# Patient Record
Sex: Female | Born: 1965 | Race: White | Hispanic: No | Marital: Single | Smoking: Current some day smoker
Health system: Southern US, Community
[De-identification: ages and names within clinical notes are randomized; demographics above are authoritative.]

## PROBLEM LIST (undated history)

## (undated) DIAGNOSIS — S069XAA Unspecified intracranial injury with loss of consciousness status unknown, initial encounter: Secondary | ICD-10-CM

## (undated) DIAGNOSIS — S42209A Unspecified fracture of upper end of unspecified humerus, initial encounter for closed fracture: Secondary | ICD-10-CM

## (undated) DIAGNOSIS — R569 Unspecified convulsions: Secondary | ICD-10-CM

## (undated) DIAGNOSIS — S069X9A Unspecified intracranial injury with loss of consciousness of unspecified duration, initial encounter: Secondary | ICD-10-CM

## (undated) DIAGNOSIS — T07XXXA Unspecified multiple injuries, initial encounter: Secondary | ICD-10-CM

## (undated) DIAGNOSIS — Z5189 Encounter for other specified aftercare: Secondary | ICD-10-CM

## (undated) DIAGNOSIS — F419 Anxiety disorder, unspecified: Secondary | ICD-10-CM

## (undated) DIAGNOSIS — F3181 Bipolar II disorder: Secondary | ICD-10-CM

## (undated) DIAGNOSIS — C801 Malignant (primary) neoplasm, unspecified: Secondary | ICD-10-CM

## (undated) DIAGNOSIS — T7500XA Unspecified effects of lightning, initial encounter: Secondary | ICD-10-CM

## (undated) HISTORY — DX: Encounter for other specified aftercare: Z51.89

## (undated) HISTORY — DX: Unspecified convulsions: R56.9

## (undated) HISTORY — DX: Malignant (primary) neoplasm, unspecified: C80.1

## (undated) HISTORY — PX: COLON SURGERY: SHX602

## (undated) HISTORY — DX: Unspecified effects of lightning, initial encounter: T75.00XA

## (undated) HISTORY — DX: Unspecified intracranial injury with loss of consciousness of unspecified duration, initial encounter: S06.9X9A

## (undated) HISTORY — PX: ABDOMINAL SURGERY: SHX537

## (undated) HISTORY — PX: LIVER REPAIR: SHX6734

## (undated) HISTORY — DX: Bipolar II disorder: F31.81

## (undated) HISTORY — DX: Unspecified intracranial injury with loss of consciousness status unknown, initial encounter: S06.9XAA

## (undated) HISTORY — DX: Anxiety disorder, unspecified: F41.9

## (undated) HISTORY — DX: Unspecified multiple injuries, initial encounter: T07.XXXA

---
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
## 2017-12-11 DIAGNOSIS — S42209A Unspecified fracture of upper end of unspecified humerus, initial encounter for closed fracture: Secondary | ICD-10-CM

## 2017-12-11 HISTORY — DX: Unspecified fracture of upper end of unspecified humerus, initial encounter for closed fracture: S42.209A

## 2019-03-29 ENCOUNTER — Ambulatory Visit: Payer: Self-pay

## 2019-03-29 NOTE — Telephone Encounter (Signed)
Pt. Reports she had a nephrostomy tube removed "about 3 weeks ago at [facility]." "I don't want to deal with them anymore." Reports over the weekend she grey drainage from the area. No fever. Has 1/4 inch opening to left back. Reports she is cleaning the area and keeping it covered. Concerned she may need an antibiotic. Has an appointment to establish care with Dr. Wynetta Emery next week. No appointments available sooner. Pt. Will go to UC for treatment. Reason for Disposition . [1] Skin around the wound has become red AND [2] larger than 2 inches (5 cm)  Answer Assessment - Initial Assessment Questions 1. LOCATION: "Where is the wound located?"      Left lower back 2. WOUND APPEARANCE: "What does the wound look like?"      Has an opening - 1/4 inch 3. SIZE: If redness is present, ask: "What is the size of the red area?" (Inches, centimeters, or compare to size of a coin)      No 4. SPREAD: "What's changed in the last day?"  "Do you see any red streaks coming from the wound?"     No 5. ONSET: "When did it start to look infected?"      Had drainage Saturday 6. MECHANISM: "How did the wound start, what was the cause?"     Nephrostomy tube 7. PAIN: "Is there any pain?" If so, ask: "How bad is the pain?"   (Scale 1-10; or mild, moderate, severe)     5 8. FEVER: "Do you have a fever?" If so, ask: "What is your temperature, how was it measured, and when did it start?"     No 9. OTHER SYMPTOMS: "Do you have any other symptoms?" (e.g., shaking chills, weakness, rash elsewhere on body)     No 10. PREGNANCY: "Is there any chance you are pregnant?" "When was your last menstrual period?"       No  Protocols used: WOUND INFECTION-A-AH

## 2019-04-04 ENCOUNTER — Ambulatory Visit (INDEPENDENT_AMBULATORY_CARE_PROVIDER_SITE_OTHER): Payer: Medicare Other | Admitting: Family Medicine

## 2019-04-04 ENCOUNTER — Other Ambulatory Visit: Payer: Self-pay

## 2019-04-04 ENCOUNTER — Encounter: Payer: Self-pay | Admitting: Family Medicine

## 2019-04-04 VITALS — Ht 68.0 in | Wt 160.0 lb

## 2019-04-04 DIAGNOSIS — R1900 Intra-abdominal and pelvic swelling, mass and lump, unspecified site: Secondary | ICD-10-CM

## 2019-04-04 DIAGNOSIS — Z9119 Patient's noncompliance with other medical treatment and regimen: Secondary | ICD-10-CM

## 2019-04-04 DIAGNOSIS — F319 Bipolar disorder, unspecified: Secondary | ICD-10-CM

## 2019-04-04 DIAGNOSIS — N1330 Unspecified hydronephrosis: Secondary | ICD-10-CM | POA: Diagnosis not present

## 2019-04-04 DIAGNOSIS — C20 Malignant neoplasm of rectum: Secondary | ICD-10-CM

## 2019-04-04 DIAGNOSIS — N135 Crossing vessel and stricture of ureter without hydronephrosis: Secondary | ICD-10-CM

## 2019-04-04 DIAGNOSIS — Z91199 Patient's noncompliance with other medical treatment and regimen due to unspecified reason: Secondary | ICD-10-CM

## 2019-04-04 DIAGNOSIS — Z936 Other artificial openings of urinary tract status: Secondary | ICD-10-CM

## 2019-04-04 DIAGNOSIS — R569 Unspecified convulsions: Secondary | ICD-10-CM

## 2019-04-04 DIAGNOSIS — Z87820 Personal history of traumatic brain injury: Secondary | ICD-10-CM

## 2019-04-04 MED ORDER — SULFAMETHOXAZOLE-TRIMETHOPRIM 800-160 MG PO TABS: 1.0000 | ORAL_TABLET | Freq: Two times a day (BID) | ORAL | 0 refills | Status: DC

## 2019-04-04 NOTE — Assessment & Plan Note (Signed)
Has stopped all her medicine. States that she only has seizures "when I take the medicine." Refuses follow up with neurology. Hospitalized in June when she stopped her vipmat for 3 days. Will refer to new neurology when patient is willing to go.

## 2019-04-04 NOTE — Assessment & Plan Note (Addendum)
Of unclear etiology- possibly fibrotic from radiation or neoplastic. Refuses any CTs. Refuses referral to oncology.

## 2019-04-04 NOTE — Assessment & Plan Note (Signed)
Has an aunt who helps out a bit. Unclear how much this is effecting her cognition or her medical literacy.

## 2019-04-04 NOTE — Assessment & Plan Note (Signed)
Patient states that she does not have cancer. She states that "[facility] lied to her to get her money" and that she does not want to see an oncologist. Discussed today that there are certain things that I cannot care for- including her rectal cancer, and advised her that I could get her a 2nd opinion with another oncologist. She refused this and became upset at the discussion of seeing other providers. Will continue to monitor. May require palliative care- as she does not think she is sick, it is unclear if she would accept this either. Continue to monitor.

## 2019-04-04 NOTE — Assessment & Plan Note (Signed)
Has not been on any medicine because she refuses it. Agitated today- but generally euthymic and able to self-correct. States that she is taking supplements. Refuses referral to psychiatry.

## 2019-04-04 NOTE — Assessment & Plan Note (Signed)
Has had her PCN tube removed about 2-3 weeks ago. She states that it wasn't draining for 2 weeks and started draining about a week ago with a "boil." Refuses CT. Refuses to get another PCN. Refuses referral for 2nd opinion to urology. Continue home health. Informed her that if she gets fevers, chills or significantly worsening flank pain, she should go to the ER.

## 2019-04-04 NOTE — Assessment & Plan Note (Signed)
Discussed today that there are certain things that I cannot care for- including her rectal cancer and her hydronephrosis, and advised her that I could get her a 2nd opinion with another oncologist or urologist not at [facility]. She refused this and became upset at the discussion of seeing other providers stating "I have the right to refuse medical care." She declines any form of screening. She may be a candidate for palliative care, however, given that she does not think she has anything wrong with her, I'm not sure how this will go over.

## 2019-04-04 NOTE — Assessment & Plan Note (Signed)
Tube has been removed, continues with open wound on her back. Has been changing dressings with home health- small amount of gray discharge on the bandage, unclear if it was pus, will give 1 week of bactrim and get her into the office for evaluation in person. Warning signs to go to the ER discussed.
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
## 2019-04-13 ENCOUNTER — Encounter: Payer: Self-pay | Admitting: Family Medicine

## 2019-04-13 ENCOUNTER — Ambulatory Visit (INDEPENDENT_AMBULATORY_CARE_PROVIDER_SITE_OTHER): Payer: Medicare Other | Admitting: Family Medicine

## 2019-04-13 ENCOUNTER — Other Ambulatory Visit: Payer: Self-pay

## 2019-04-13 VITALS — BP 147/89 | HR 109 | Temp 98.4°F

## 2019-04-13 DIAGNOSIS — I700 Atherosclerosis of aorta: Secondary | ICD-10-CM

## 2019-04-13 DIAGNOSIS — C20 Malignant neoplasm of rectum: Secondary | ICD-10-CM

## 2019-04-13 DIAGNOSIS — G8929 Other chronic pain: Secondary | ICD-10-CM

## 2019-04-13 DIAGNOSIS — R5383 Other fatigue: Secondary | ICD-10-CM

## 2019-04-13 DIAGNOSIS — Z936 Other artificial openings of urinary tract status: Secondary | ICD-10-CM | POA: Diagnosis not present

## 2019-04-13 DIAGNOSIS — Z114 Encounter for screening for human immunodeficiency virus [HIV]: Secondary | ICD-10-CM

## 2019-04-13 DIAGNOSIS — N1330 Unspecified hydronephrosis: Secondary | ICD-10-CM

## 2019-04-13 DIAGNOSIS — Z9119 Patient's noncompliance with other medical treatment and regimen: Secondary | ICD-10-CM

## 2019-04-13 DIAGNOSIS — N135 Crossing vessel and stricture of ureter without hydronephrosis: Secondary | ICD-10-CM | POA: Diagnosis not present

## 2019-04-13 DIAGNOSIS — Z91199 Patient's noncompliance with other medical treatment and regimen due to unspecified reason: Secondary | ICD-10-CM

## 2019-04-13 DIAGNOSIS — M545 Low back pain: Secondary | ICD-10-CM

## 2019-04-13 DIAGNOSIS — Z13220 Encounter for screening for lipoid disorders: Secondary | ICD-10-CM

## 2019-04-13 LAB — MICROSCOPIC EXAMINATION
Bacteria, UA: NONE SEEN
RBC, Urine: NONE SEEN /hpf (ref 0–2)

## 2019-04-13 LAB — UA/M W/RFLX CULTURE, ROUTINE
Bilirubin, UA: NEGATIVE
Glucose, UA: NEGATIVE
Leukocytes,UA: NEGATIVE
Nitrite, UA: NEGATIVE
RBC, UA: NEGATIVE
Specific Gravity, UA: >1.03 — ABNORMAL HIGH (ref 1.005–1.030)
Urobilinogen, Ur: 0.2 mg/dL (ref 0.2–1.0)
pH, UA: 5 (ref 5.0–7.5)

## 2019-04-13 NOTE — Assessment & Plan Note (Signed)
Had PCN tube removed about 4-5 weeks ago. Appears to be draining small amount of yellow liquid. Appears to be closing. Does not want to go back to urology. Would consider renal ultrasound. Will think about it and call if she decides to. Call with any concerns. Checking labs today.

## 2019-04-13 NOTE — Assessment & Plan Note (Signed)
No sign of infection. Appears to be draining small amount of yellow liquid. Appears to be closing. Does not want to go back to urology. Would consider renal ultrasound. Will think about it and call if she decides to. Call with any concerns.

## 2019-04-13 NOTE — Progress Notes (Signed)
BP (!) 147/89   Pulse (!) 109   Temp 98.4 F (36.9 C)   SpO2 100%    Subjective:    Patient ID: Heather Turner, female    DOB: 1965/12/29, 53 y.o.   MRN: [id number]  HPI: Heather Turner is a 53 y.o. female  Chief Complaint  Patient presents with  . Wound Check   Felt well on the antibiotics. She has a nurse who comes by and she notes that she was feeling well. She notes that last night she was in a lot of pain. She was draining and having pus coming back.   Relevant past medical, surgical, family and social history reviewed and updated as indicated. Interim medical history since our last visit reviewed. Allergies and medications reviewed and updated.  Review of Systems  Constitutional: Negative.   Respiratory: Negative.   Cardiovascular: Negative.   Gastrointestinal: Positive for abdominal pain. Negative for abdominal distention, anal bleeding, blood in stool, constipation, diarrhea, nausea, rectal pain and vomiting.       Lower belly cramping  Musculoskeletal: Positive for back pain and myalgias. Negative for arthralgias, gait problem, joint swelling, neck pain and neck stiffness.  Skin: Positive for wound. Negative for color change, pallor and rash.  Psychiatric/Behavioral: Negative.     Per HPI unless specifically indicated above     Objective:    BP (!) 147/89   Pulse (!) 109   Temp 98.4 F (36.9 C)   SpO2 100%   Wt Readings from Last 3 Encounters:  04/04/19 160 lb (72.6 kg)    Physical Exam Vitals signs and nursing note reviewed.  Constitutional:      General: She is not in acute distress.    Appearance: Normal appearance. She is not ill-appearing, toxic-appearing or diaphoretic.  HENT:     Head: Normocephalic and atraumatic.     Right Ear: External ear normal.     Left Ear: External ear normal.     Nose: Nose normal.     Mouth/Throat:     Mouth: Mucous membranes are moist.     Pharynx: Oropharynx is clear.  Eyes:     General: No scleral icterus.        Right eye: No discharge.        Left eye: No discharge.     Extraocular Movements: Extraocular movements intact.     Conjunctiva/sclera: Conjunctivae normal.     Pupils: Pupils are equal, round, and reactive to light.  Neck:     Musculoskeletal: Normal range of motion and neck supple.  Cardiovascular:     Rate and Rhythm: Normal rate and regular rhythm.     Pulses: Normal pulses.     Heart sounds: Normal heart sounds. No murmur. No friction rub. No gallop.   Pulmonary:     Effort: Pulmonary effort is normal. No respiratory distress.     Breath sounds: Normal breath sounds. No stridor. No wheezing, rhonchi or rales.  Chest:     Chest wall: No tenderness.  Musculoskeletal: Normal range of motion.  Skin:    General: Skin is warm and dry.     Capillary Refill: Capillary refill takes less than 2 seconds.     Coloration: Skin is not jaundiced or pale.     Findings: No bruising, erythema, lesion or rash.     Comments: Open wound about 3 mm in size on L lower back draining clear yellow fluid. No redness, no swelling  Neurological:     General: No focal  deficit present.     Mental Status: She is alert and oriented to person, place, and time. Mental status is at baseline.  Psychiatric:        Mood and Affect: Mood normal.        Behavior: Behavior normal.        Thought Content: Thought content normal.        Judgment: Judgment normal.     No results found for this or any previous visit.    Assessment & Plan:   Problem List Items Addressed This Visit      Digestive   Rectal cancer [facility])    Patient previously stated that she does not have cancer. She stated that "[facility] lied to her to get her money" and that she does not want to see an oncologist. Discussed today that there are certain things that I cannot care for- including her rectal cancer, and advised her that I could get her a 2nd opinion with another oncologist. She refused this. Will check labs today. Will continue to  monitor. May require palliative care- as she does not think she is sick, it is unclear if she would accept this either. Will continue to establish report and see how things go.       Relevant Orders   CBC with Differential/Platelet   Comprehensive metabolic panel     Genitourinary   Hydronephrosis of left kidney    Had PCN tube removed about 4-5 weeks ago. Appears to be draining small amount of yellow liquid. Appears to be closing. Does not want to go back to urology. Would consider renal ultrasound. Will think about it and call if she decides to. Call with any concerns. Checking labs today.      Relevant Orders   CBC with Differential/Platelet   Comprehensive metabolic panel   UA/M w/rflx Culture, Routine   Ureteral obstruction, right    Had PCN tube removed about 4-5 weeks ago. Appears to be draining small amount of yellow liquid. Appears to be closing. Does not want to go back to urology. Would consider renal ultrasound. Will think about it and call if she decides to. Call with any concerns. Checking labs today.      Relevant Orders   CBC with Differential/Platelet   Comprehensive metabolic panel   UA/M w/rflx Culture, Routine     Other   History of noncompliance with medical treatment    Newly establishing care with patient. Will attempt to build report. Have offered renal ultrasound to look at status of hydronephrosis/periaortic mass- she will consider. Does not want CT due to radiation. Call with any concerns.       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 Appears to be draining small amount of yellow liquid. Appears to be closing. Does not want to go back to urology. Would consider renal ultrasound. Will think about it and call if she decides to. Call with any concerns.       Relevant Orders   CBC with Differential/Platelet   Comprehensive metabolic panel   UA/M w/rflx Culture, Routine    Other Visit Diagnoses    Screening  for cholesterol level       Labs drawn today. Await results.    Relevant Orders   Lipid Panel w/o Chol/HDL Ratio   Screening for HIV without presence of risk factors       Labs drawn today. Await results.    Relevant Orders  HIV Antibody (routine testing w rflx)   Fatigue, unspecified type       Labs drawn today. Await results.    Relevant Orders   TSH   Chronic right-sided low back pain without sciatica       Would like to see chiropractor. Referral generated today.    Relevant Orders   Ambulatory referral to Chiropractic       Follow up plan: Return in about 4 weeks (around 05/11/2019) for follow up .

## 2019-04-13 NOTE — Patient Instructions (Signed)
Abdominal or Pelvic Ultrasound An ultrasound is a test that uses sound waves to take pictures of the inside of the body. This is a safe and painless test that does not expose you to any X-rays. It is done using a handheld device (transducer) that is placed on your abdomen or pelvis and moved around. The transducer sends out sound waves that reflect off your tissues and organs to create images on a computer screen. An abdominal ultrasound takes pictures of the inside of the abdomen. A pelvic ultrasound takes pictures of the inside of the pelvis. An abdominal or pelvic ultrasound may be done to:  Check the shape or size of an organ.  Check for problems such as: ? Cysts. ? Masses. ? Inflammation. ? Kidney stones. ? Gallstones. Tell a health care provider about:  Any allergies you have.  All medicines you are taking, including vitamins, herbs, eye drops, creams, and over-the-counter medicines.  Any surgeries you have had.  Any medical conditions you have.  Whether you are pregnant or may be pregnant. What are the risks? There are no known risks or complications from having this test. What happens before the procedure?  Follow instructions from your health care provider about eating or drinking before the test.  Wear clothing that is easily washable in case the gel used for the test gets on your clothes. What happens during the procedure?   You will lie on an exam table.  Your lower abdomen and pelvis will be exposed.  A gel will be applied to your skin. It may feel cool.  The transducer will be pressed on your abdomen or pelvis and moved back and forth, through the gel, over the area to be examined.  The transducer will take pictures. These will be displayed on one or more computer monitors that look like small TV screens.  You may be asked to change your position.  After the exam, the gel will be cleaned off. What happens after the procedure?  It is up to you to get your  test results. Ask your health care provider, or the department that is doing the test, when your results will be ready.  Keep all follow-up visits as told by your health care provider. This is important. Summary  An ultrasound is a test that uses sound waves to take pictures of the inside of the body.  An abdominal or pelvic ultrasound may be done to check for cysts, masses, inflammation, kidney stones, or gallstones.  During the procedure, a handheld device (transducer) will be placed on your abdomen or pelvis and moved around over the area to be examined.  Ask your health care provider, or the department that is doing the test, when your results will be ready. This information is not intended to replace advice given to you by your health care provider. Make sure you discuss any questions you have with your health care provider. Document Released: 07/16/2000 Document Revised: 02/13/2018 Document Reviewed: 02/13/2018 Elsevier Patient Education  [place].

## 2019-04-13 NOTE — Assessment & Plan Note (Signed)
Newly establishing care with patient. Will attempt to build report. Have offered renal ultrasound to look at status of hydronephrosis/periaortic mass- she will consider. Does not want CT due to radiation. Call with any concerns.

## 2019-04-13 NOTE — Assessment & Plan Note (Signed)
Patient previously stated that she does not have cancer. She stated that "[facility] lied to her to get her money" and that she does not want to see an oncologist. Discussed today that there are certain things that I cannot care for- including her rectal cancer, and advised her that I could get her a 2nd opinion with another oncologist. She refused this. Will check labs today. Will continue to monitor. May require palliative care- as she does not think she is sick, it is unclear if she would accept this either. Will continue to establish report and see how things go.

## 2019-04-14 LAB — COMPREHENSIVE METABOLIC PANEL
ALT: 5 IU/L (ref 0–32)
AST: 11 IU/L (ref 0–40)
Albumin/Globulin Ratio: 1.1 — ABNORMAL LOW (ref 1.2–2.2)
Albumin: 3.8 g/dL (ref 3.8–4.8)
Alkaline Phosphatase: 114 IU/L (ref 39–117)
BUN/Creatinine Ratio: 14 (ref 9–23)
BUN: 12 mg/dL (ref 6–24)
Bilirubin Total: <0.2 mg/dL (ref 0.0–1.2)
CO2: 22 mmol/L (ref 20–29)
Calcium: 9.8 mg/dL (ref 8.7–10.2)
Chloride: 104 mmol/L (ref 96–106)
Creatinine, Ser: 0.85 mg/dL (ref 0.57–1.00)
GFR calc Af Amer: 92 mL/min/{1.73_m2} (ref >59)
GFR calc non Af Amer: 80 mL/min/{1.73_m2} (ref >59)
Globulin, Total: 3.4 g/dL (ref 1.5–4.5)
Glucose: 84 mg/dL (ref 65–99)
Potassium: 4.1 mmol/L (ref 3.5–5.2)
Sodium: 144 mmol/L (ref 134–144)
Total Protein: 7.2 g/dL (ref 6.0–8.5)

## 2019-04-14 LAB — CBC WITH DIFFERENTIAL/PLATELET
Basophils Absolute: 0 10*3/uL (ref 0.0–0.2)
Basos: 1 %
EOS (ABSOLUTE): 0.1 10*3/uL (ref 0.0–0.4)
Eos: 1 %
Hematocrit: 33 % — ABNORMAL LOW (ref 34.0–46.6)
Hemoglobin: 11.1 g/dL (ref 11.1–15.9)
Immature Grans (Abs): 0 10*3/uL (ref 0.0–0.1)
Immature Granulocytes: 0 %
Lymphocytes Absolute: 1.5 10*3/uL (ref 0.7–3.1)
Lymphs: 19 %
MCH: 29.1 pg (ref 26.6–33.0)
MCHC: 33.6 g/dL (ref 31.5–35.7)
MCV: 87 fL (ref 79–97)
Monocytes Absolute: 0.6 10*3/uL (ref 0.1–0.9)
Monocytes: 8 %
Neutrophils Absolute: 5.6 10*3/uL (ref 1.4–7.0)
Neutrophils: 71 %
Platelets: 441 10*3/uL (ref 150–450)
RBC: 3.81 x10E6/uL (ref 3.77–5.28)
RDW: 15.5 % — ABNORMAL HIGH (ref 11.7–15.4)
WBC: 7.9 10*3/uL (ref 3.4–10.8)

## 2019-04-14 LAB — TSH: TSH: 1.26 u[IU]/mL (ref 0.450–4.500)

## 2019-04-14 LAB — LIPID PANEL W/O CHOL/HDL RATIO
Cholesterol, Total: 131 mg/dL (ref 100–199)
HDL: 32 mg/dL — ABNORMAL LOW (ref >39)
LDL Chol Calc (NIH): 69 mg/dL (ref 0–99)
Triglycerides: 173 mg/dL — ABNORMAL HIGH (ref 0–149)
VLDL Cholesterol Cal: 30 mg/dL (ref 5–40)

## 2019-04-14 LAB — HIV ANTIBODY (ROUTINE TESTING W REFLEX): HIV Screen 4th Generation wRfx: NONREACTIVE

## 2019-04-15 ENCOUNTER — Encounter: Payer: Self-pay | Admitting: Family Medicine

## 2019-04-16 ENCOUNTER — Telehealth: Payer: Self-pay | Admitting: Family Medicine

## 2019-04-16 NOTE — Telephone Encounter (Signed)
Patient notified

## 2019-04-16 NOTE — Telephone Encounter (Signed)
Copied from [place] [id number]. Topic: General - Call Back - No Documentation >> Apr 16, 2019  3:23 PM Erick Blinks wrote: Reason for CRM: Pt called requesting call back to discuss lab results please advise  Best contacT [phone]

## 2019-04-16 NOTE — Telephone Encounter (Signed)
Letter was generated. OK to give results in the letter.

## 2019-05-01 ENCOUNTER — Telehealth: Payer: Self-pay | Admitting: Family Medicine

## 2019-05-01 DIAGNOSIS — N135 Crossing vessel and stricture of ureter without hydronephrosis: Secondary | ICD-10-CM

## 2019-05-01 DIAGNOSIS — C20 Malignant neoplasm of rectum: Secondary | ICD-10-CM

## 2019-05-01 DIAGNOSIS — Z936 Other artificial openings of urinary tract status: Secondary | ICD-10-CM

## 2019-05-01 NOTE — Telephone Encounter (Signed)
Pt called and stated that she is having extreme back pain. She would like to have something called in to [facility]. Tried to schedule an appt but pt declined.

## 2019-05-01 NOTE — Addendum Note (Signed)
Addended by: Valerie Roys on: 05/01/2019 01:50 PM   Modules accepted: Orders

## 2019-05-01 NOTE — Telephone Encounter (Signed)
I cannot call her in anything without her being seen.

## 2019-05-01 NOTE — Addendum Note (Signed)
Addended by: De Hollingshead on: 05/01/2019 01:41 PM   Modules accepted: Orders

## 2019-05-01 NOTE — Telephone Encounter (Signed)
Spoke with pt and advised her that Dr Wynetta Emery would like for her to come in to be seen. While speaking the pt began to cry. She stated that she didn't have a way to get here and that she now has no money. She stated that she has spent all her money on [place] powder, aleve and tylenol. After speaking with Dr Wynetta Emery I advised the pt that she would need to either come in to the office to be seen or she can go to the UC/ER. Pt became upset and began to cry again. She stated that because Dr Wynetta Emery is her doctor she would like to have something sent in. She stated that she was not going to the ER because they will only tell her that there are a lot of things going on and she is totally fine. While trying to explain why pt needed to come in she hung up. Provider notified. Marland Kitchen CCM referral already entered.

## 2019-05-01 NOTE — Telephone Encounter (Signed)
I'm happy to see her if she can get here. Has a history of non-compliance, rectal cancer, periaortic mass, renal obstruction. Had her nephrostomy tube removed. Not interested in having it replaced. Denies that anything is wrong with her. I cannot call her in medication without seeing her.

## 2019-05-01 NOTE — Telephone Encounter (Signed)
Placed Care Guide referral for transportation and other financial needs.   We actually do not have a CCM referral - Dr. Wynetta Emery, would you mind placing this for the CCM team?  Thanks!

## 2019-05-02 ENCOUNTER — Ambulatory Visit: Payer: Medicare Other | Admitting: *Deleted

## 2019-05-02 ENCOUNTER — Ambulatory Visit: Payer: Medicare Other | Admitting: Pharmacist

## 2019-05-02 DIAGNOSIS — G8929 Other chronic pain: Secondary | ICD-10-CM

## 2019-05-02 NOTE — Chronic Care Management (AMB) (Signed)
  Chronic Care Management   Note  05/02/2019 Name: Heather Turner MRN: [id number] DOB: 07-04-1966  Heather Turner is a 53 y.o. year old female who is a primary care patient of Valerie Roys, DO. The CCM team was consulted for assistance with chronic disease management and care coordination needs.    Contacted patient telephonically with Janci Minor, RN CM today, to discuss CCM team and to attempt to help with transportation concerns that are noted in the phone note from yesterday. Unable to review CCM program and consent information with patient. We asked about her pain, noted that she has been in pain since her nephrostomy tube was removed in August, but that the pain has been excruciating for the past 3 weeks. Notes she has tried Advil, Aleeve, and Tylenol, but any will only help for about 15 minutes. Offered to help with transportation to her PCP office; she declined, noting that she can't sit in a car, can't get groceries. Asked if she thought she needed to call EMS for transportation; she became irate, asking "is that the only reason you called today?" and wanting to stop "being bothered by y'all". Patient hung up on [place].   Alerting Dr. Wynetta Emery to the above.   Catie Darnelle Maffucci, PharmD Clinical Pharmacist [place] [phone]

## 2019-05-05 NOTE — Chronic Care Management (AMB) (Signed)
  Chronic Care Management   Outreach Note  05/05/2019 Name: Heather Turner MRN: [id number] DOB: [birth year]  Referred by: Valerie Roys, DO Reason for referral : Chronic Care Management (Initial Call for CCM )   An initial  telephone outreach was attempted today. The patient was referred to the case management team by for assistance with care management and care coordination.  Patient answered the phone and confirmed her identity. CCM Pharm D and I started the discussion around patient coming into the office related to patient complaining of severe back pain. Patient stated she could not manage to come in she was in too much pain. Suggested she may need to the ED if she was unable to come in to PCP office related to severe pain. Patient sounded as if she became very agitated and stated. "You just want me to come in there so you can take my money" Patient growled into the phone and hung up.  Made PCP aware of this interaction.   Follow Up Plan: The care management team will reach out to the patient again over the next 30 days.   Merlene Morse Lendora Keys RN, BSN Nurse Case Editor, [facility]  [phone]) [employer]

## 2019-05-15 ENCOUNTER — Telehealth: Payer: Self-pay

## 2019-05-15 NOTE — Telephone Encounter (Signed)
Called to provide transportation information. Pt states that they currently have transportation and they are in no need of additional assistance.

## 2019-05-16 ENCOUNTER — Telehealth: Payer: Self-pay

## 2019-05-17 ENCOUNTER — Other Ambulatory Visit: Payer: Self-pay

## 2019-05-17 ENCOUNTER — Ambulatory Visit (INDEPENDENT_AMBULATORY_CARE_PROVIDER_SITE_OTHER): Payer: Medicare Other | Admitting: Family Medicine

## 2019-05-17 ENCOUNTER — Telehealth: Payer: Self-pay | Admitting: Adult Health Nurse Practitioner

## 2019-05-17 ENCOUNTER — Encounter: Payer: Self-pay | Admitting: Family Medicine

## 2019-05-17 VITALS — BP 161/105 | HR 107 | Temp 98.6°F | Ht 68.0 in | Wt 157.0 lb

## 2019-05-17 DIAGNOSIS — R109 Unspecified abdominal pain: Secondary | ICD-10-CM

## 2019-05-17 DIAGNOSIS — Z91199 Patient's noncompliance with other medical treatment and regimen due to unspecified reason: Secondary | ICD-10-CM

## 2019-05-17 DIAGNOSIS — N135 Crossing vessel and stricture of ureter without hydronephrosis: Secondary | ICD-10-CM | POA: Diagnosis not present

## 2019-05-17 DIAGNOSIS — Z87820 Personal history of traumatic brain injury: Secondary | ICD-10-CM

## 2019-05-17 DIAGNOSIS — Z9119 Patient's noncompliance with other medical treatment and regimen: Secondary | ICD-10-CM

## 2019-05-17 DIAGNOSIS — C20 Malignant neoplasm of rectum: Secondary | ICD-10-CM

## 2019-05-17 DIAGNOSIS — N1330 Unspecified hydronephrosis: Secondary | ICD-10-CM

## 2019-05-17 MED ORDER — TRAZODONE HCL 50 MG PO TABS: 25.0000 mg | ORAL_TABLET | Freq: Every evening | ORAL | 3 refills | Status: DC | PRN

## 2019-05-17 MED ORDER — LIDOCAINE 5 % EX PTCH: 1.0000 | MEDICATED_PATCH | CUTANEOUS | 12 refills | Status: DC

## 2019-05-17 MED ORDER — DICLOFENAC SODIUM 1 % TD GEL: 4.0000 g | Freq: Four times a day (QID) | TRANSDERMAL | 3 refills | Status: DC

## 2019-05-17 NOTE — Assessment & Plan Note (Signed)
Getting progressively worse. Does not want to do [place] to look for hydronephrosis. Does not want to do CT or anything with radiation. She thinks that this is related her to her back and would like to do chiropractry- they were not able to move forward with that due to concern about the initial x-ray. We will arrange for her to have x-ray ASAP. Pain is making it hard for her to sleep. She is willing to have palliative come out- if they recommend pain medicine, we will consider- for now will treat with voltaren and lidocaine patches. Continue to monitor closely.

## 2019-05-17 NOTE — Assessment & Plan Note (Signed)
Does not want to do imaging right now. Will monitor. Will check BMP- will get palliative involved and she will see how chiropractor works. Continue to monitor.

## 2019-05-17 NOTE — Progress Notes (Signed)
BP (!) 161/105   Pulse (!) 107   Temp 98.6 F (37 C) (Oral)   Ht 5\' 8"  (1.727 m)   Wt 157 lb (71.2 kg)   SpO2 98%   BMI 23.87 kg/m    Subjective:    Patient ID: Heather Turner, female    DOB: 1966-06-03, 53 y.o.   MRN: [id number]  HPI: Heather Turner is a 53 y.o. female  Chief Complaint  Patient presents with  . Back Pain    4w f/u   Heather Turner presents today with her aunt for follow up. She has not been able to get into see the chiropractor due to medicare not covering the initial x-ray. She notes that her pain has been getting worse. She continues to be able to pee. She notes that she is waking up at night due to the pain. It's located in the middle of her low back. It also radiates into her L flank near her wound from her ostomy. That continues to drain, but not significantly. She is not having any fevers or chills. She is very uncomfortable and has quite a bit of pain. She does not want to do any imaging (she will do the x-ray for the chiropractor) as she doesn't want any radiation. She is still considering doing the [place]. She is otherwise feeling OK. No other concerns or complaints at this time.   Relevant past medical, surgical, family and social history reviewed and updated as indicated. Interim medical history since our last visit reviewed. Allergies and medications reviewed and updated.  Review of Systems  Constitutional: Negative.   Respiratory: Negative.   Cardiovascular: Negative.   Gastrointestinal: Negative.   Genitourinary: Positive for flank pain. Negative for decreased urine volume, difficulty urinating, dyspareunia, dysuria, enuresis, frequency, genital sores, hematuria, menstrual problem, pelvic pain, urgency, vaginal bleeding, vaginal discharge and vaginal pain.  Musculoskeletal: Positive for back pain and myalgias. Negative for arthralgias, gait problem, joint swelling, neck pain and neck stiffness.  Skin: Negative.   Neurological: Negative.    Psychiatric/Behavioral: Negative.     Per HPI unless specifically indicated above     Objective:    BP (!) 161/105   Pulse (!) 107   Temp 98.6 F (37 C) (Oral)   Ht 5\' 8"  (1.727 m)   Wt 157 lb (71.2 kg)   SpO2 98%   BMI 23.87 kg/m   Wt Readings from Last 3 Encounters:  05/17/19 157 lb (71.2 kg)  04/04/19 160 lb (72.6 kg)    Physical Exam Vitals signs and nursing note reviewed.  Constitutional:      General: She is not in acute distress.    Appearance: Normal appearance. She is not ill-appearing, toxic-appearing or diaphoretic.  HENT:     Head: Normocephalic and atraumatic.     Right Ear: External ear normal.     Left Ear: External ear normal.     Nose: Nose normal.     Mouth/Throat:     Mouth: Mucous membranes are moist.     Pharynx: Oropharynx is clear.  Eyes:     General: No scleral icterus.       Right eye: No discharge.        Left eye: No discharge.     Extraocular Movements: Extraocular movements intact.     Conjunctiva/sclera: Conjunctivae normal.     Pupils: Pupils are equal, round, and reactive to light.  Neck:     Musculoskeletal: Normal range of motion and neck supple.  Cardiovascular:  Rate and Rhythm: Normal rate and regular rhythm.     Pulses: Normal pulses.     Heart sounds: Normal heart sounds. No murmur. No friction rub. No gallop.   Pulmonary:     Effort: Pulmonary effort is normal. No respiratory distress.     Breath sounds: Normal breath sounds. No stridor. No wheezing, rhonchi or rales.  Chest:     Chest wall: No tenderness.  Abdominal:     General: Abdomen is flat. Bowel sounds are normal. There is no distension.     Palpations: Abdomen is soft. There is no mass.     Tenderness: There is no abdominal tenderness. There is left CVA tenderness. There is no guarding or rebound.     Hernia: No hernia is present.  Musculoskeletal: Normal range of motion.        General: Tenderness present.  Skin:    General: Skin is warm and dry.      Capillary Refill: Capillary refill takes less than 2 seconds.     Coloration: Skin is not jaundiced or pale.     Findings: No bruising, erythema, lesion or rash.  Neurological:     General: No focal deficit present.     Mental Status: She is alert and oriented to person, place, and time. Mental status is at baseline.  Psychiatric:        Mood and Affect: Mood normal.        Behavior: Behavior normal.        Thought Content: Thought content normal.        Judgment: Judgment normal.     Results for orders placed or performed in visit on [id number]  Basic metabolic panel  Result Value Ref Range   Glucose 77 65 - 99 mg/dL   BUN 13 6 - 24 mg/dL   Creatinine, Ser 0.88 0.57 - 1.00 mg/dL   GFR calc non Af Amer 77 >59 mL/min/1.73   GFR calc Af Amer 89 >59 mL/min/1.73   BUN/Creatinine Ratio 15 9 - 23   Sodium 142 134 - 144 mmol/L   Potassium 4.1 3.5 - 5.2 mmol/L   Chloride 101 96 - 106 mmol/L   CO2 25 20 - 29 mmol/L   Calcium 9.8 8.7 - 10.2 mg/dL      Assessment & Plan:   Problem List Items Addressed This Visit      Digestive   Rectal cancer ([place])   Relevant Medications   BAYER ASPIRIN PO   Other Relevant Orders   Amb Referral to Palliative Care     Nervous and Auditory   History of traumatic brain injury   Relevant Orders   Amb Referral to Palliative Care     Genitourinary   Hydronephrosis of left kidney    Does not want to do imaging right now. Will monitor. Will check BMP- will get palliative involved and she will see how chiropractor works. Continue to monitor. Start voltaren and lidoderm. Call if not getting better or getting worse.       Relevant Orders   Amb Referral to Palliative Care   Ureteral obstruction, right    Does not want to do imaging right now. Will monitor. Will check BMP- will get palliative involved and she will see how chiropractor works. Continue to monitor.       Relevant Orders   Amb Referral to Palliative Care     Other   History of  noncompliance with medical treatment    Now with increased pain  of unclear etiology, likely from hydronephrosis, however patient refuses imaging. Will get palliative involved. She thinks that her pain is due to muscular issues- will get her into chiropracty. Checking labs today. Await results.       Relevant Orders   Amb Referral to Palliative Care   Left flank pain - Primary    Getting progressively worse. Does not want to do [place] to look for hydronephrosis. Does not want to do CT or anything with radiation. She thinks that this is related her to her back and would like to do chiropractry- they were not able to move forward with that due to concern about the initial x-ray. We will arrange for her to have x-ray ASAP. Pain is making it hard for her to sleep. She is willing to have palliative come out- if they recommend pain medicine, we will consider- for now will treat with voltaren and lidocaine patches. Continue to monitor closely.      Relevant Orders   Basic metabolic panel (Completed)       Follow up plan: Return in about 2 weeks (around 05/31/2019).

## 2019-05-17 NOTE — Assessment & Plan Note (Signed)
Does not want to do imaging right now. Will monitor. Will check BMP- will get palliative involved and she will see how chiropractor works. Continue to monitor. Start voltaren and lidoderm. Call if not getting better or getting worse.

## 2019-05-17 NOTE — Assessment & Plan Note (Signed)
Now with increased pain of unclear etiology, likely from hydronephrosis, however patient refuses imaging. Will get palliative involved. She thinks that her pain is due to muscular issues- will get her into chiropracty. Checking labs today. Await results.

## 2019-05-17 NOTE — Telephone Encounter (Signed)
Spoke with patient regarding Palliative services and she did agree to this.  I have scheduled a Telephone Consult for 05/18/19 @ 12 Noon

## 2019-05-18 ENCOUNTER — Other Ambulatory Visit: Payer: Self-pay

## 2019-05-18 ENCOUNTER — Telehealth: Payer: Self-pay

## 2019-05-18 ENCOUNTER — Encounter: Payer: Self-pay | Admitting: Family Medicine

## 2019-05-18 ENCOUNTER — Ambulatory Visit: Payer: Medicare Other | Admitting: Family Medicine

## 2019-05-18 ENCOUNTER — Other Ambulatory Visit: Payer: Medicare Other | Admitting: Adult Health Nurse Practitioner

## 2019-05-18 DIAGNOSIS — Z515 Encounter for palliative care: Secondary | ICD-10-CM

## 2019-05-18 LAB — BASIC METABOLIC PANEL
BUN/Creatinine Ratio: 15 (ref 9–23)
BUN: 13 mg/dL (ref 6–24)
CO2: 25 mmol/L (ref 20–29)
Calcium: 9.8 mg/dL (ref 8.7–10.2)
Chloride: 101 mmol/L (ref 96–106)
Creatinine, Ser: 0.88 mg/dL (ref 0.57–1.00)
GFR calc Af Amer: 89 mL/min/{1.73_m2} (ref >59)
GFR calc non Af Amer: 77 mL/min/{1.73_m2} (ref >59)
Glucose: 77 mg/dL (ref 65–99)
Potassium: 4.1 mmol/L (ref 3.5–5.2)
Sodium: 142 mmol/L (ref 134–144)

## 2019-05-18 NOTE — Telephone Encounter (Signed)
At the direction of Amy NP, message via epic sent to PCP with an update on visit today including recommendations of beginning hydrocodone 5/325 mg QHS.
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
## 2019-05-21 ENCOUNTER — Other Ambulatory Visit: Payer: Self-pay | Admitting: Family Medicine

## 2019-05-21 MED ORDER — HYDROCODONE-ACETAMINOPHEN 5-325 MG PO TABS: 1.0000 | ORAL_TABLET | Freq: Every evening | ORAL | 0 refills | Status: DC | PRN

## 2019-05-25 ENCOUNTER — Other Ambulatory Visit: Payer: Self-pay | Admitting: Family Medicine

## 2019-05-25 MED ORDER — HYDROCODONE-ACETAMINOPHEN 5-325 MG PO TABS: 1.0000 | ORAL_TABLET | Freq: Every evening | ORAL | 0 refills | Status: DC | PRN

## 2019-05-25 NOTE — Telephone Encounter (Signed)
Requested medication (s) are due for refill today: yes  Requested medication (s) are on the active medication list: yes  Last refill:  05/21/2019  Future visit scheduled: yes  Notes to clinic:  Review for refill  Requested Prescriptions  Pending Prescriptions Disp Refills   HYDROcodone-acetaminophen (NORCO/VICODIN) 5-325 MG tablet 5 tablet 0    Sig: Take 1 tablet by mouth at bedtime as needed for moderate pain.     Not Delegated - Analgesics:  Opioid Agonist Combinations Failed - 05/25/2019 10:26 AM      Failed - This refill cannot be delegated      Failed - Urine Drug Screen completed in last 360 days.      Passed - Valid encounter within last 6 months    Recent Outpatient Visits          1 week ago Left flank pain   [place], Megan P, DO   1 month ago Status of artificial opening of urinary tract ([place])   [place], Megan P, DO   1 month ago History of traumatic brain injury   [place], DO      Future Appointments            In 1 week Wynetta Emery, Barb Merino, DO [employer]

## 2019-05-25 NOTE — Telephone Encounter (Signed)
Copied from [place] [id number]. Topic: Quick Communication - Rx Refill/Question >> May 25, 2019 10:21 AM Rainey Pines A wrote: Medication: HYDROcodone-acetaminophen (NORCO/VICODIN) 5-325 MG tablet  Has the patient contacted their pharmacy? {Yes (Agent: If no, request that the patient contact the pharmacy for the refill.) (Agent: If yes, when and what did the pharmacy advise?)Contact PCP  Preferred Pharmacy (with phone number or street name): [place], [place] - [address] [phone] (Phone) [phone] (Fax)    Agent: Please be advised that RX refills may take up to 3 business days. We ask that you follow-up with your pharmacy.

## 2019-05-25 NOTE — Telephone Encounter (Signed)
Patient is calling to check on the request of this medication refill Please advise

## 2019-05-25 NOTE — Telephone Encounter (Signed)
Patient notified that RX was sent in.

## 2019-05-31 ENCOUNTER — Other Ambulatory Visit: Payer: Medicare Other | Admitting: Adult Health Nurse Practitioner

## 2019-05-31 ENCOUNTER — Other Ambulatory Visit: Payer: Self-pay

## 2019-05-31 DIAGNOSIS — Z515 Encounter for palliative care: Secondary | ICD-10-CM
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
## 2019-06-01 ENCOUNTER — Ambulatory Visit: Payer: Medicare Other | Admitting: Family Medicine

## 2019-07-11 ENCOUNTER — Encounter: Payer: Self-pay | Admitting: Emergency Medicine

## 2019-07-11 ENCOUNTER — Observation Stay: Payer: Medicare Other

## 2019-07-11 ENCOUNTER — Inpatient Hospital Stay
Admission: EM | Admit: 2019-07-11 | Discharge: 2019-07-15 | DRG: 389 | Disposition: A | Discharge status: Home / Self Care | Payer: Medicare Other | Attending: Internal Medicine | Admitting: Internal Medicine

## 2019-07-11 ENCOUNTER — Other Ambulatory Visit: Payer: Self-pay

## 2019-07-11 ENCOUNTER — Emergency Department: Payer: Medicare Other

## 2019-07-11 DIAGNOSIS — M545 Low back pain: Secondary | ICD-10-CM

## 2019-07-11 DIAGNOSIS — Z9221 Personal history of antineoplastic chemotherapy: Secondary | ICD-10-CM

## 2019-07-11 DIAGNOSIS — F319 Bipolar disorder, unspecified: Secondary | ICD-10-CM

## 2019-07-11 DIAGNOSIS — F17210 Nicotine dependence, cigarettes, uncomplicated: Secondary | ICD-10-CM | POA: Diagnosis present

## 2019-07-11 DIAGNOSIS — Z91040 Latex allergy status: Secondary | ICD-10-CM

## 2019-07-11 DIAGNOSIS — C20 Malignant neoplasm of rectum: Secondary | ICD-10-CM | POA: Diagnosis not present

## 2019-07-11 DIAGNOSIS — R9389 Abnormal findings on diagnostic imaging of other specified body structures: Secondary | ICD-10-CM

## 2019-07-11 DIAGNOSIS — Z85048 Personal history of other malignant neoplasm of rectum, rectosigmoid junction, and anus: Secondary | ICD-10-CM

## 2019-07-11 DIAGNOSIS — Z880 Allergy status to penicillin: Secondary | ICD-10-CM

## 2019-07-11 DIAGNOSIS — C7800 Secondary malignant neoplasm of unspecified lung: Secondary | ICD-10-CM | POA: Diagnosis present

## 2019-07-11 DIAGNOSIS — N1330 Unspecified hydronephrosis: Secondary | ICD-10-CM

## 2019-07-11 DIAGNOSIS — C787 Secondary malignant neoplasm of liver and intrahepatic bile duct: Secondary | ICD-10-CM | POA: Diagnosis present

## 2019-07-11 DIAGNOSIS — E876 Hypokalemia: Secondary | ICD-10-CM | POA: Diagnosis present

## 2019-07-11 DIAGNOSIS — Z7189 Other specified counseling: Secondary | ICD-10-CM

## 2019-07-11 DIAGNOSIS — G8929 Other chronic pain: Secondary | ICD-10-CM | POA: Diagnosis present

## 2019-07-11 DIAGNOSIS — K56609 Unspecified intestinal obstruction, unspecified as to partial versus complete obstruction: Principal | ICD-10-CM

## 2019-07-11 DIAGNOSIS — Z9049 Acquired absence of other specified parts of digestive tract: Secondary | ICD-10-CM

## 2019-07-11 DIAGNOSIS — Z87440 Personal history of urinary (tract) infections: Secondary | ICD-10-CM

## 2019-07-11 DIAGNOSIS — Z87820 Personal history of traumatic brain injury: Secondary | ICD-10-CM

## 2019-07-11 DIAGNOSIS — C7989 Secondary malignant neoplasm of other specified sites: Secondary | ICD-10-CM | POA: Diagnosis present

## 2019-07-11 DIAGNOSIS — F3181 Bipolar II disorder: Secondary | ICD-10-CM | POA: Diagnosis present

## 2019-07-11 DIAGNOSIS — R3129 Other microscopic hematuria: Secondary | ICD-10-CM | POA: Diagnosis present

## 2019-07-11 DIAGNOSIS — Z515 Encounter for palliative care: Secondary | ICD-10-CM

## 2019-07-11 DIAGNOSIS — Z9119 Patient's noncompliance with other medical treatment and regimen: Secondary | ICD-10-CM

## 2019-07-11 DIAGNOSIS — N131 Hydronephrosis with ureteral stricture, not elsewhere classified: Secondary | ICD-10-CM | POA: Diagnosis present

## 2019-07-11 DIAGNOSIS — Z885 Allergy status to narcotic agent status: Secondary | ICD-10-CM

## 2019-07-11 DIAGNOSIS — Z79891 Long term (current) use of opiate analgesic: Secondary | ICD-10-CM

## 2019-07-11 DIAGNOSIS — N132 Hydronephrosis with renal and ureteral calculous obstruction: Secondary | ICD-10-CM

## 2019-07-11 DIAGNOSIS — Z79899 Other long term (current) drug therapy: Secondary | ICD-10-CM

## 2019-07-11 DIAGNOSIS — F419 Anxiety disorder, unspecified: Secondary | ICD-10-CM | POA: Diagnosis present

## 2019-07-11 DIAGNOSIS — Z5320 Procedure and treatment not carried out because of patient's decision for unspecified reasons: Secondary | ICD-10-CM | POA: Diagnosis not present

## 2019-07-11 DIAGNOSIS — Z888 Allergy status to other drugs, medicaments and biological substances status: Secondary | ICD-10-CM

## 2019-07-11 DIAGNOSIS — Z8249 Family history of ischemic heart disease and other diseases of the circulatory system: Secondary | ICD-10-CM

## 2019-07-11 DIAGNOSIS — I700 Atherosclerosis of aorta: Secondary | ICD-10-CM | POA: Diagnosis present

## 2019-07-11 DIAGNOSIS — Z7982 Long term (current) use of aspirin: Secondary | ICD-10-CM

## 2019-07-11 DIAGNOSIS — C799 Secondary malignant neoplasm of unspecified site: Secondary | ICD-10-CM

## 2019-07-11 DIAGNOSIS — Z20828 Contact with and (suspected) exposure to other viral communicable diseases: Secondary | ICD-10-CM | POA: Diagnosis present

## 2019-07-11 DIAGNOSIS — C786 Secondary malignant neoplasm of retroperitoneum and peritoneum: Secondary | ICD-10-CM | POA: Diagnosis present

## 2019-07-11 DIAGNOSIS — I10 Essential (primary) hypertension: Secondary | ICD-10-CM | POA: Diagnosis present

## 2019-07-11 LAB — BASIC METABOLIC PANEL
Anion gap: 11 (ref 5–15)
BUN: 16 mg/dL (ref 6–20)
CO2: 29 mmol/L (ref 22–32)
Calcium: 10.2 mg/dL (ref 8.9–10.3)
Chloride: 98 mmol/L (ref 98–111)
Creatinine, Ser: 0.84 mg/dL (ref 0.44–1.00)
GFR calc Af Amer: >60 mL/min (ref >60)
GFR calc non Af Amer: >60 mL/min (ref >60)
Glucose, Bld: 129 mg/dL — ABNORMAL HIGH (ref 70–99)
Potassium: 3.5 mmol/L (ref 3.5–5.1)
Sodium: 138 mmol/L (ref 135–145)

## 2019-07-11 LAB — RESPIRATORY PANEL BY RT PCR (FLU A&B, COVID)
Influenza A by PCR: NEGATIVE
Influenza B by PCR: NEGATIVE
SARS Coronavirus 2 by RT PCR: NEGATIVE

## 2019-07-11 LAB — CBC
HCT: 40.4 % (ref 36.0–46.0)
Hemoglobin: 12.9 g/dL (ref 12.0–15.0)
MCH: 27.4 pg (ref 26.0–34.0)
MCHC: 31.9 g/dL (ref 30.0–36.0)
MCV: 85.8 fL (ref 80.0–100.0)
Platelets: 377 10*3/uL (ref 150–400)
RBC: 4.71 MIL/uL (ref 3.87–5.11)
RDW: 15.3 % (ref 11.5–15.5)
WBC: 12.7 10*3/uL — ABNORMAL HIGH (ref 4.0–10.5)
nRBC: 0 % (ref 0.0–0.2)

## 2019-07-11 LAB — URINALYSIS, COMPLETE (UACMP) WITH MICROSCOPIC
Bacteria, UA: NONE SEEN
Bilirubin Urine: NEGATIVE
Glucose, UA: NEGATIVE mg/dL
Hgb urine dipstick: NEGATIVE
Ketones, ur: NEGATIVE mg/dL
Leukocytes,Ua: NEGATIVE
Nitrite: NEGATIVE
Protein, ur: 100 mg/dL — AB
Specific Gravity, Urine: 1.019 (ref 1.005–1.030)
pH: 6 (ref 5.0–8.0)

## 2019-07-11 LAB — PREGNANCY, URINE: Preg Test, Ur: NEGATIVE

## 2019-07-11 MED ORDER — DEXTROSE-NACL 5-0.45 % IV SOLN
INTRAVENOUS | Status: DC
  Administered 2019-07-11 – 2019-07-12 (×2): via INTRAVENOUS

## 2019-07-11 MED ORDER — SODIUM CHLORIDE 0.9 % IV BOLUS
1000.0000 mL | Freq: Once | INTRAVENOUS | Status: AC
  Administered 2019-07-11: 1000 mL via INTRAVENOUS

## 2019-07-11 MED ORDER — ONDANSETRON HCL 4 MG/2ML IJ SOLN: 4.0000 mg | Freq: Four times a day (QID) | INTRAMUSCULAR | Status: DC | PRN

## 2019-07-11 MED ORDER — ACETAMINOPHEN 650 MG RE SUPP: 650.0000 mg | Freq: Four times a day (QID) | RECTAL | Status: DC | PRN

## 2019-07-11 MED ORDER — HYDROCODONE-ACETAMINOPHEN 5-325 MG PO TABS: 1.0000 | ORAL_TABLET | Freq: Every evening | ORAL | Status: DC | PRN

## 2019-07-11 MED ORDER — ACETAMINOPHEN 325 MG PO TABS
650.0000 mg | ORAL_TABLET | Freq: Four times a day (QID) | ORAL | Status: DC | PRN
  Administered 2019-07-11 – 2019-07-13 (×3): 650 mg via ORAL
  Filled 2019-07-11 (×3): qty 2

## 2019-07-11 MED ORDER — ENOXAPARIN SODIUM 40 MG/0.4ML ~~LOC~~ SOLN
40.0000 mg | SUBCUTANEOUS | Status: DC
  Administered 2019-07-11 – 2019-07-14 (×4): 40 mg via SUBCUTANEOUS
  Filled 2019-07-11 (×5): qty 0.4

## 2019-07-11 MED ORDER — KETOROLAC TROMETHAMINE 30 MG/ML IJ SOLN
30.0000 mg | Freq: Once | INTRAMUSCULAR | Status: AC
  Administered 2019-07-11: 12:00:00 30 mg via INTRAVENOUS

## 2019-07-11 MED ORDER — TRAZODONE HCL 50 MG PO TABS
25.0000 mg | ORAL_TABLET | Freq: Every evening | ORAL | Status: DC | PRN
  Administered 2019-07-13: 21:00:00 50 mg via ORAL
  Filled 2019-07-11 (×2): qty 1

## 2019-07-11 MED ORDER — ONDANSETRON HCL 4 MG PO TABS
4.0000 mg | ORAL_TABLET | Freq: Four times a day (QID) | ORAL | Status: DC | PRN
  Filled 2019-07-11: qty 1

## 2019-07-11 MED ORDER — KETOROLAC TROMETHAMINE 30 MG/ML IJ SOLN
30.0000 mg | Freq: Once | INTRAMUSCULAR | Status: DC
  Filled 2019-07-11: qty 1

## 2019-07-11 MED ORDER — KETOROLAC TROMETHAMINE 30 MG/ML IJ SOLN
30.0000 mg | Freq: Four times a day (QID) | INTRAMUSCULAR | Status: DC | PRN
  Administered 2019-07-11 – 2019-07-15 (×10): 30 mg via INTRAVENOUS
  Filled 2019-07-11 (×14): qty 1

## 2019-07-11 MED ORDER — HYDROCODONE-ACETAMINOPHEN 5-325 MG PO TABS
1.0000 | ORAL_TABLET | Freq: Four times a day (QID) | ORAL | Status: DC | PRN
  Administered 2019-07-11 – 2019-07-15 (×14): 1 via ORAL
  Filled 2019-07-11 (×14): qty 1

## 2019-07-11 NOTE — ED Notes (Signed)
Provider at bedside to update patient.  

## 2019-07-11 NOTE — ED Notes (Signed)
Hospitalist to bedside.

## 2019-07-11 NOTE — ED Notes (Signed)
Attempted to insert NG, pt began screaming, grabbing nurse's hand and refused another attempt to insert NG.

## 2019-07-11 NOTE — ED Notes (Signed)
Pt ambulatory to Cpod
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
## 2019-07-11 NOTE — ED Notes (Signed)
Lab notified to add on Urine Culture. 

## 2019-07-11 NOTE — ED Notes (Signed)
Hospitalist, [place] aware of patient refusal of NG tube; states its ok to hold NG tube as long as patient is not vomiting.
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
## 2019-07-11 NOTE — ED Notes (Signed)
Patient transported to CT 

## 2019-07-11 NOTE — ED Notes (Signed)
Pharmacy to bedside; med req updated.

## 2019-07-11 NOTE — ED Notes (Signed)
C/O lower back pain x 1 month.  States back pain has prevented her from sleeping well for one month.  Lower abdominal pain and dysuria x 2 days.  AAOx3.  Skin warm and dry. NAD

## 2019-07-11 NOTE — ED Triage Notes (Signed)
C/O foul smelling urine and pain with urination x 2 days. States symptoms worsened this morning at 0200.

## 2019-07-11 NOTE — ED Triage Notes (Signed)
In via EMS from home with c/o UTI. EMS reports pt with dysuria and a fouls smell in her urine BP 180/100, NO meds, no allergies. 98.1Temp
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
## 2019-07-12 ENCOUNTER — Observation Stay: Payer: Medicare Other

## 2019-07-12 ENCOUNTER — Encounter: Payer: Self-pay | Admitting: Internal Medicine

## 2019-07-12 ENCOUNTER — Inpatient Hospital Stay: Payer: Medicare Other

## 2019-07-12 DIAGNOSIS — E876 Hypokalemia: Secondary | ICD-10-CM | POA: Diagnosis present

## 2019-07-12 DIAGNOSIS — C7800 Secondary malignant neoplasm of unspecified lung: Secondary | ICD-10-CM | POA: Diagnosis present

## 2019-07-12 DIAGNOSIS — Z7982 Long term (current) use of aspirin: Secondary | ICD-10-CM | POA: Diagnosis not present

## 2019-07-12 DIAGNOSIS — Z79899 Other long term (current) drug therapy: Secondary | ICD-10-CM | POA: Diagnosis not present

## 2019-07-12 DIAGNOSIS — Z91040 Latex allergy status: Secondary | ICD-10-CM | POA: Diagnosis not present

## 2019-07-12 DIAGNOSIS — G8929 Other chronic pain: Secondary | ICD-10-CM | POA: Diagnosis present

## 2019-07-12 DIAGNOSIS — Z20828 Contact with and (suspected) exposure to other viral communicable diseases: Secondary | ICD-10-CM | POA: Diagnosis present

## 2019-07-12 DIAGNOSIS — Z515 Encounter for palliative care: Secondary | ICD-10-CM | POA: Diagnosis not present

## 2019-07-12 DIAGNOSIS — C7989 Secondary malignant neoplasm of other specified sites: Secondary | ICD-10-CM | POA: Diagnosis present

## 2019-07-12 DIAGNOSIS — Z885 Allergy status to narcotic agent status: Secondary | ICD-10-CM | POA: Diagnosis not present

## 2019-07-12 DIAGNOSIS — F17210 Nicotine dependence, cigarettes, uncomplicated: Secondary | ICD-10-CM | POA: Diagnosis present

## 2019-07-12 DIAGNOSIS — Z880 Allergy status to penicillin: Secondary | ICD-10-CM | POA: Diagnosis not present

## 2019-07-12 DIAGNOSIS — N131 Hydronephrosis with ureteral stricture, not elsewhere classified: Secondary | ICD-10-CM | POA: Diagnosis present

## 2019-07-12 DIAGNOSIS — K56609 Unspecified intestinal obstruction, unspecified as to partial versus complete obstruction: Secondary | ICD-10-CM | POA: Diagnosis present

## 2019-07-12 DIAGNOSIS — R3129 Other microscopic hematuria: Secondary | ICD-10-CM | POA: Diagnosis present

## 2019-07-12 DIAGNOSIS — I10 Essential (primary) hypertension: Secondary | ICD-10-CM | POA: Diagnosis present

## 2019-07-12 DIAGNOSIS — Z87440 Personal history of urinary (tract) infections: Secondary | ICD-10-CM | POA: Diagnosis not present

## 2019-07-12 DIAGNOSIS — F419 Anxiety disorder, unspecified: Secondary | ICD-10-CM | POA: Diagnosis present

## 2019-07-12 DIAGNOSIS — Z888 Allergy status to other drugs, medicaments and biological substances status: Secondary | ICD-10-CM | POA: Diagnosis not present

## 2019-07-12 DIAGNOSIS — C787 Secondary malignant neoplasm of liver and intrahepatic bile duct: Secondary | ICD-10-CM | POA: Diagnosis present

## 2019-07-12 DIAGNOSIS — Z87820 Personal history of traumatic brain injury: Secondary | ICD-10-CM | POA: Diagnosis not present

## 2019-07-12 DIAGNOSIS — Z79891 Long term (current) use of opiate analgesic: Secondary | ICD-10-CM | POA: Diagnosis not present

## 2019-07-12 DIAGNOSIS — F3181 Bipolar II disorder: Secondary | ICD-10-CM | POA: Diagnosis present

## 2019-07-12 DIAGNOSIS — C799 Secondary malignant neoplasm of unspecified site: Secondary | ICD-10-CM | POA: Diagnosis not present

## 2019-07-12 DIAGNOSIS — I700 Atherosclerosis of aorta: Secondary | ICD-10-CM | POA: Diagnosis present

## 2019-07-12 DIAGNOSIS — C20 Malignant neoplasm of rectum: Secondary | ICD-10-CM | POA: Diagnosis not present

## 2019-07-12 DIAGNOSIS — C786 Secondary malignant neoplasm of retroperitoneum and peritoneum: Secondary | ICD-10-CM | POA: Diagnosis present

## 2019-07-12 LAB — COMPREHENSIVE METABOLIC PANEL
ALT: 7 U/L (ref 0–44)
AST: 11 U/L — ABNORMAL LOW (ref 15–41)
Albumin: 3.2 g/dL — ABNORMAL LOW (ref 3.5–5.0)
Alkaline Phosphatase: 107 U/L (ref 38–126)
Anion gap: 8 (ref 5–15)
BUN: 12 mg/dL (ref 6–20)
CO2: 28 mmol/L (ref 22–32)
Calcium: 9 mg/dL (ref 8.9–10.3)
Chloride: 103 mmol/L (ref 98–111)
Creatinine, Ser: 0.7 mg/dL (ref 0.44–1.00)
GFR calc Af Amer: >60 mL/min (ref >60)
GFR calc non Af Amer: >60 mL/min (ref >60)
Glucose, Bld: 118 mg/dL — ABNORMAL HIGH (ref 70–99)
Potassium: 2.9 mmol/L — ABNORMAL LOW (ref 3.5–5.1)
Sodium: 139 mmol/L (ref 135–145)
Total Bilirubin: 0.7 mg/dL (ref 0.3–1.2)
Total Protein: 6.4 g/dL — ABNORMAL LOW (ref 6.5–8.1)

## 2019-07-12 LAB — URINE CULTURE

## 2019-07-12 LAB — MAGNESIUM: Magnesium: 2.2 mg/dL (ref 1.7–2.4)

## 2019-07-12 LAB — PHOSPHORUS: Phosphorus: 3.3 mg/dL (ref 2.5–4.6)

## 2019-07-12 MED ORDER — LORAZEPAM 2 MG/ML IJ SOLN
1.0000 mg | Freq: Once | INTRAMUSCULAR | Status: AC
  Administered 2019-07-12: 1 mg via INTRAVENOUS
  Filled 2019-07-12: qty 1

## 2019-07-12 MED ORDER — KCL IN DEXTROSE-NACL 40-5-0.9 MEQ/L-%-% IV SOLN
INTRAVENOUS | Status: DC
  Administered 2019-07-12 – 2019-07-14 (×6): via INTRAVENOUS
  Filled 2019-07-12 (×8): qty 1000

## 2019-07-12 MED ORDER — LABETALOL HCL 5 MG/ML IV SOLN
5.0000 mg | Freq: Four times a day (QID) | INTRAVENOUS | Status: DC | PRN
  Administered 2019-07-12 – 2019-07-15 (×4): 5 mg via INTRAVENOUS
  Filled 2019-07-12 (×4): qty 4

## 2019-07-12 MED ORDER — GADOBUTROL 1 MMOL/ML IV SOLN
7.0000 mL | Freq: Once | INTRAVENOUS | Status: AC | PRN
  Administered 2019-07-12: 7 mL via INTRAVENOUS

## 2019-07-12 NOTE — Progress Notes (Signed)
Patient refused nasogastric tube. Patient states "I'm not gonna do it. It hurts too much and I don't want it." Dr. Peyton Najjar notified.

## 2019-07-12 NOTE — Progress Notes (Signed)
Complained of chronic back pain, requested an ice back; small ice pack applied;Barbaraann Faster, RN 3:50 AM; 07/12/2019

## 2019-07-12 NOTE — Progress Notes (Signed)
MRI called to say that they would try to get patient down around 0700; acknowledged. Patient able to sleep some. No nausea/vomitting overnight. Barbaraann Faster, RN 6:02 AM 07/12/2019

## 2019-07-12 NOTE — Progress Notes (Addendum)
Progress Note    Heather Turner  [id number] DOB: April 19, 1966  DOA: 07/11/2019 PCP: Valerie Roys, DO        Assessment/Plan:   Active Problems:   SBO (small bowel obstruction) (HCC)   Body mass index is 23.77 kg/m.   Small bowel obstruction: Keep n.p.o. for now.  She refused NG tube placement.  She could not complete the MRI.  She attributed this to pain from laying on the MRI stretcher.  Even after giving her pain medicines prior to second attempt for MRI test, patient did not complete the test.  IV fluids for hydration.  Analgesics as needed for pain.  Follow-up with general surgeon for further recommendations.  Hypertension: IV labetalol as needed for severe hypertension.  Hypokalemia: Replete potassium  Severe left hydroureteronephrosis: Previously, she had a percutaneous nephrostomy tube for this and was removed in August 2020 per her request.  Patient was seen by the urologist and patient refused to have a nephrostomy tube.  Rectal cancer with nodules in the lung and liver: Patient did not have the MRI test.  She likely has advanced rectal cancer.  Patient elected not to see oncology again because she thinks she does not have cancer  History of bipolar disorder: No longer on medications.     Family Communication/Anticipated D/C date and plan/Code Status   DVT prophylaxis: Lovenox Code Status: Full code Family Communication: None Disposition Plan: To be determined      Subjective:   C/o abdominal pain and low back pain.  She said back pain is a chronic pain she has been dealing with.  She has had abdominal pain for the past few days.  No bowel movement for 1 week.  No vomiting.  Objective:    Vitals:   07/12/19 0009 07/12/19 0027 07/12/19 0418 07/12/19 0523  BP: (!) 159/90 (!) 169/98 (!) 167/96   Pulse:  96 94   Resp:  18 18   Temp:  98.1 F (36.7 C) 98.1 F (36.7 C)   TempSrc:  Oral Oral   SpO2:  100% 98%   Weight:    70.9 kg  Height:     5\' 8"  (1.727 m)    Intake/Output Summary (Last 24 hours) at 07/12/2019 1149 Last data filed at 07/12/2019 1029 Gross per 24 hour  Intake 1805.01 ml  Output 350 ml  Net 1455.01 ml   Filed Weights   07/12/19 0523  Weight: 70.9 kg    Exam:  GEN: NAD SKIN: No rash EYES: EOMI ENT: MMM CV: RRR PULM: CTA B ABD: soft, ND, right-sided tenderness without rebound tenderness or guarding, +BS CNS: AAO x 3, non focal EXT: No edema or tenderness MSK: Mild lumbar spinal tenderness  Data Reviewed:   I have personally reviewed following labs and imaging studies:  Labs: Labs show the following:   Basic Metabolic Panel: Recent Labs  Lab 07/11/19 0951 07/12/19 0442  NA 138 139  K 3.5 2.9*  CL 98 103  CO2 29 28  GLUCOSE 129* 118*  BUN 16 12  CREATININE 0.84 0.70  CALCIUM 10.2 9.0  MG  --  2.2  PHOS  --  3.3   GFR Estimated Creatinine Clearance: 84.9 mL/min (by C-G formula based on SCr of 0.7 mg/dL). Liver Function Tests: Recent Labs  Lab 07/12/19 0442  AST 11*  ALT 7  ALKPHOS 107  BILITOT 0.7  PROT 6.4*  ALBUMIN 3.2*   No results for input(s): LIPASE, AMYLASE in the last  168 hours. No results for input(s): AMMONIA in the last 168 hours. Coagulation profile No results for input(s): INR, PROTIME in the last 168 hours.  CBC: Recent Labs  Lab 07/11/19 0951  WBC 12.7*  HGB 12.9  HCT 40.4  MCV 85.8  PLT 377   Cardiac Enzymes: No results for input(s): CKTOTAL, CKMB, CKMBINDEX, TROPONINI in the last 168 hours. BNP (last 3 results) No results for input(s): PROBNP in the last 8760 hours. CBG: No results for input(s): GLUCAP in the last 168 hours. D-Dimer: No results for input(s): DDIMER in the last 72 hours. Hgb A1c: No results for input(s): HGBA1C in the last 72 hours. Lipid Profile: No results for input(s): CHOL, HDL, LDLCALC, TRIG, CHOLHDL, LDLDIRECT in the last 72 hours. Thyroid function studies: No results for input(s): TSH, T4TOTAL, T3FREE,  THYROIDAB in the last 72 hours.  Invalid input(s): FREET3 Anemia work up: No results for input(s): VITAMINB12, FOLATE, FERRITIN, TIBC, IRON, RETICCTPCT in the last 72 hours. Sepsis Labs: Recent Labs  Lab 07/11/19 0951  WBC 12.7*    Microbiology Recent Results (from the past 240 hour(s))  Respiratory Panel by RT PCR (Flu A&B, Covid) - Nasopharyngeal Swab     Status: None   Collection Time: 07/11/19  2:20 PM   Specimen: Nasopharyngeal Swab  Result Value Ref Range Status   SARS Coronavirus 2 by RT PCR NEGATIVE NEGATIVE Final    Comment: (NOTE) SARS-CoV-2 target nucleic acids are NOT DETECTED. The SARS-CoV-2 RNA is generally detectable in upper respiratoy specimens during the acute phase of infection. The lowest concentration of SARS-CoV-2 viral copies this assay can detect is 131 copies/mL. A negative result does not preclude SARS-Cov-2 infection and should not be used as the sole basis for treatment or other patient management decisions. A negative result may occur with  improper specimen collection/handling, submission of specimen other than nasopharyngeal swab, presence of viral mutation(s) within the areas targeted by this assay, and inadequate number of viral copies (<131 copies/mL). A negative result must be combined with clinical observations, patient history, and epidemiological information. The expected result is Negative. Fact Sheet for Patients:  PinkCheek.be Fact Sheet for Healthcare Providers:  GravelBags.it This test is not yet ap proved or cleared by the [place] FDA and  has been authorized for detection and/or diagnosis of SARS-CoV-2 by FDA under an Emergency Use Authorization (EUA). This EUA will remain  in effect (meaning this test can be used) for the duration of the COVID-19 declaration under Section 564(b)(1) of the Act, 21 U.S.C. section 360bbb-3(b)(1), unless the authorization is terminated or  revoked sooner.    Influenza A by PCR NEGATIVE NEGATIVE Final   Influenza B by PCR NEGATIVE NEGATIVE Final    Comment: (NOTE) The Xpert Xpress SARS-CoV-2/FLU/RSV assay is intended as an aid in  the diagnosis of influenza from Nasopharyngeal swab specimens and  should not be used as a sole basis for treatment. Nasal washings and  aspirates are unacceptable for Xpert Xpress SARS-CoV-2/FLU/RSV  testing. Fact Sheet for Patients: PinkCheek.be Fact Sheet for Healthcare Providers: GravelBags.it This test is not yet approved or cleared by the [place] FDA and  has been authorized for detection and/or diagnosis of SARS-CoV-2 by  FDA under an Emergency Use Authorization (EUA). This EUA will remain  in effect (meaning this test can be used) for the duration of the  Covid-19 declaration under Section 564(b)(1) of the Act, 21  U.S.C. section 360bbb-3(b)(1), unless the authorization is  terminated or revoked. Performed at  [facility], [address] [place], [place] [address]     Procedures and diagnostic studies:  DG Skull 1-3 Views  Result Date: 07/11/2019 CLINICAL DATA:  MRI clearance EXAM: SKULL - 1-3 VIEW COMPARISON:  None. FINDINGS: Changes consistent with dental fillings are noted. Prior fixation sideplate is noted in the anterior aspect of right mandible. No radiopaque foreign bodies are noted within the orbits. No other focal abnormality is seen. IMPRESSION: Surgical fixation sideplate in the right mandible. No metallic foreign bodies are noted. Electronically Signed   By: Inez Catalina M.D.   On: 07/11/2019 18:03   CT Renal Stone Study  Result Date: 07/11/2019 CLINICAL DATA:  Low back pain for 1 month. No known injury. History of rectal cancer and left hydronephrosis. EXAM: CT ABDOMEN AND PELVIS WITHOUT CONTRAST TECHNIQUE: Multidetector CT imaging of the abdomen and pelvis was performed following the standard  protocol without IV contrast. COMPARISON:  None. FINDINGS: Lower chest: There is a nodule in the left lower lobe measuring 1.5 x 1 cm on axial image 11 by 0.9 cm on sagittal image 132. The nodule appears part solid. A 0.4 cm right lower lobe nodule is identified on image 12 of series 4 and a 0.7 cm left lower lobe nodule seen on image 13. Punctate left lower lobe nodule on image 15 also noted. Mild linear atelectasis or scar in the right middle lobe is noted. Lung bases otherwise clear. No pleural or pericardial effusion. Hepatobiliary: A 1.1 cm in diameter hypoattenuating lesion in the posterior right hepatic lobe on is seen on image 24. There is a second hypoattenuating lesion in the lateral segment of the left hepatic lobe measuring 1 cm on image 23. The liver is otherwise unremarkable. Gallbladder and biliary tree appear normal. Pancreas: Unremarkable. No pancreatic ductal dilatation or surrounding inflammatory changes. Spleen: Normal in size without focal abnormality. Adrenals/Urinary Tract: The patient has severe left hydronephrosis. Intermediate Hyperattenuating material is seen in the left renal pelvis most worrisome for hemorrhage possibly debris. No obstructing stone is identified. Scarring in the left flank has an appearance most consistent with the presence of a nephrostomy tube in the past. The right kidney appears normal. The urinary bladder is almost completely decompressed. The adrenal glands are unremarkable. Stomach/Bowel: Small bowel loops are dilated up to approximately 3.5 cm with air-fluid levels present. Fecalized contents in small bowel are seen in the central pelvis and distal loops are completely decompressed consistent with obstruction. There is some gas and stool in the ascending colon. Suture material at the rectosigmoid colon noted. There is gas and stool in the ascending and transverse colon. The remainder of the colon is decompressed. The stomach and appendix appear normal.  Vascular/Lymphatic: Aortic atherosclerosis. No enlarged abdominal or pelvic lymph nodes. Reproductive: Uterus and bilateral adnexa are unremarkable. Other: There is a small volume of free fluid about the spleen. No free air. Musculoskeletal: No acute or focal abnormality. IMPRESSION: The examination is positive for small bowel obstruction. Transition point appears to be in the pelvis and the obstruction is likely due to adhesions. Two hypoattenuating lesions in the liver cannot be definitively characterized. Given the patient's history of cancer, metastatic disease is possible. MRI with and without contrast could be used for further evaluation. Severe left hydronephrosis. Cause for hydronephrosis is not identified. There is some high attenuation material within the renal pelvis which could be due to hemorrhage although the patient has no blood in her urine on urinalysis today. Mass lesion or proteinaceous debris  are also possible. There is a tract in the left flank from a prior nephrostomy tube. Small volume of free fluid in the left upper quadrant scratch the small volume of perisplenic fluid of unclear etiology. 3 pulmonary nodules are identified. The largest is in the posterior left lower lobe measuring 1.5 cm. The nodule appears part solid. Follow-up non-contrast CT recommended at 3-6 months to confirm persistence. If unchanged, and solid component remains <6 mm, annual CT is recommended until 5 years of stability has been established. If persistent these nodules should be considered highly suspicious if the solid component of the nodule is 6 mm or greater in size and enlarging. This recommendation follows the consensus statement: Guidelines for Management of Incidental Pulmonary Nodules Detected on CT Images: From the Fleischner Society 2017; Radiology 2017; 284:228-243. Atherosclerosis. Electronically Signed   By: Inge Rise M.D.   On: 07/11/2019 12:11    Medications:   . enoxaparin (LOVENOX)  injection  40 mg Subcutaneous Q24H   Continuous Infusions: . dextrose 5 % and 0.9 % NaCl with KCl 40 mEq/L 100 mL/hr at 07/12/19 0856     LOS: 0 days   Nilesh Stegall  [facility]   *Please refer to [email or web address], password [email or web address] to get updated schedule on who will round on this patient, as hospitalists switch teams weekly. If 7PM-7AM, please contact night-coverage at [email or web address], password [email or web address] for any overnight needs.  07/12/2019, 11:49 AM

## 2019-07-12 NOTE — Care Management Obs Status (Signed)
[place] [facility] NOTIFICATION   Patient Details  Name: Inice Martinovic MRN: [id number] Date of Birth: 08/08/1965   Medicare Observation Status Notification Given:  Yes    Trecia Rogers, LCSW 07/12/2019, 2:17 PM

## 2019-07-12 NOTE — Progress Notes (Signed)
[facility] Hospital Day(s): 0.   Post op day(s):  Marland Kitchen   Interval History: Patient seen and examined, no acute events or new complaints overnight. Patient reports tenderness in her abdomen.  Cannot specify the area of the pain.  Pain does not radiate to the part of the body.  No alleviating or aggravating factor.  Denies nausea or vomiting.  Denies passing gas.  Vital signs in last 24 hours: [min-max] current  Temp:  [98.1 F (36.7 C)-98.3 F (36.8 C)] 98.1 F (36.7 C) (12/10 0418) Pulse Rate:  [94-103] 94 (12/10 0418) Resp:  [18] 18 (12/10 0418) BP: (159-169)/(90-100) 167/96 (12/10 0418) SpO2:  [96 %-100 %] 98 % (12/10 0418) Weight:  [70.9 kg] 70.9 kg (12/10 0523)     Height: 5\' 8"  (172.7 cm) Weight: 70.9 kg BMI (Calculated): 23.77   Physical Exam:  Constitutional: alert, cooperative and no distress  Respiratory: breathing non-labored at rest  Cardiovascular: regular rate and sinus rhythm  Gastrointestinal: soft, non-tender, and distended and tympanic  Labs:  CBC Latest Ref Rng & Units 07/11/2019 04/13/2019  WBC 4.0 - 10.5 K/uL 12.7(H) 7.9  Hemoglobin 12.0 - 15.0 g/dL 12.9 11.1  Hematocrit 36.0 - 46.0 % 40.4 33.0(L)  Platelets 150 - 400 K/uL 377 441   CMP Latest Ref Rng & Units 07/12/2019 07/11/2019 05/17/2019  Glucose 70 - 99 mg/dL 118(H) 129(H) 77  BUN 6 - 20 mg/dL 12 16 13   Creatinine 0.44 - 1.00 mg/dL 0.70 0.84 0.88  Sodium 135 - 145 mmol/L 139 138 142  Potassium 3.5 - 5.1 mmol/L 2.9(L) 3.5 4.1  Chloride 98 - 111 mmol/L 103 98 101  CO2 22 - 32 mmol/L 28 29 25   Calcium 8.9 - 10.3 mg/dL 9.0 10.2 9.8  Total Protein 6.5 - 8.1 g/dL 6.4(L) - -  Total Bilirubin 0.3 - 1.2 mg/dL 0.7 - -  Alkaline Phos 38 - 126 U/L 107 - -  AST 15 - 41 U/L 11(L) - -  ALT 0 - 44 U/L 7 - -    Imaging studies: I personally evaluated the abdominal x-ray.  There is small bowel dilation with a lot of stool in the right colon.  Descending colon and rectum not appreciated.  No free  air.   Assessment/Plan:  52 y.o. female with small bowel obstruction, complicated by pertinent comorbidities including anxiety, history of rectal cancer without completion of adjuvant therapy, anxiety, bipolar 2 disorder, seizures. Patient with persistent small bowel obstruction.  Trial was to avoid NGT due to patient anxiety but with the persistent small bowel obstruction the best treatment for her will be to put NG tube for small bowel decompression.  With NGT in place may proceed with Gastrografin challenge.  I agree with MRI order.  I will follow her with physical exam.  No worsening abdominal pain, no clinical deterioration.  We will try to continue conservative management.   Arnold Long, MD

## 2019-07-12 NOTE — Progress Notes (Signed)
Secure chatted admitting MD prior to computer downtime; without response until this am (was off); secure chatted covering NP X3 without response to have admitting orders that are "signed and held" to be reviewed so that they can be released; [facility] [phone] and asked for covering NP to review admitting orders so that they can be released; [facility], Domingo Pulse also notified. Barbaraann Faster, RN; 4:52 AM; 07/12/2019

## 2019-07-13 ENCOUNTER — Inpatient Hospital Stay: Payer: Medicare Other

## 2019-07-13 DIAGNOSIS — C20 Malignant neoplasm of rectum: Secondary | ICD-10-CM

## 2019-07-13 DIAGNOSIS — Z7189 Other specified counseling: Secondary | ICD-10-CM

## 2019-07-13 DIAGNOSIS — C799 Secondary malignant neoplasm of unspecified site: Secondary | ICD-10-CM

## 2019-07-13 DIAGNOSIS — Z515 Encounter for palliative care: Secondary | ICD-10-CM

## 2019-07-13 LAB — BASIC METABOLIC PANEL
Anion gap: 11 (ref 5–15)
BUN: 10 mg/dL (ref 6–20)
CO2: 25 mmol/L (ref 22–32)
Calcium: 9 mg/dL (ref 8.9–10.3)
Chloride: 104 mmol/L (ref 98–111)
Creatinine, Ser: 0.77 mg/dL (ref 0.44–1.00)
GFR calc Af Amer: >60 mL/min (ref >60)
GFR calc non Af Amer: >60 mL/min (ref >60)
Glucose, Bld: 90 mg/dL (ref 70–99)
Potassium: 3 mmol/L — ABNORMAL LOW (ref 3.5–5.1)
Sodium: 140 mmol/L (ref 135–145)

## 2019-07-13 MED ORDER — AMLODIPINE BESYLATE 5 MG PO TABS
5.0000 mg | ORAL_TABLET | Freq: Every day | ORAL | Status: DC
  Administered 2019-07-13 – 2019-07-14 (×2): 5 mg via ORAL
  Filled 2019-07-13 (×2): qty 1

## 2019-07-13 MED ORDER — POTASSIUM CHLORIDE CRYS ER 20 MEQ PO TBCR
40.0000 meq | EXTENDED_RELEASE_TABLET | ORAL | Status: AC
  Administered 2019-07-13 (×2): 40 meq via ORAL
  Filled 2019-07-13 (×3): qty 2

## 2019-07-13 MED ORDER — LORAZEPAM 2 MG/ML IJ SOLN
1.0000 mg | Freq: Once | INTRAMUSCULAR | Status: AC
  Administered 2019-07-13: 14:00:00 1 mg via INTRAVENOUS
  Filled 2019-07-13: qty 1

## 2019-07-13 NOTE — Progress Notes (Signed)
[facility] Hospital Day(s): 1.   Post op day(s):  Marland Kitchen   Interval History: Patient seen and examined, no acute events or new complaints overnight. Patient reports having abdominal pain due to feeling hunger.  Denies nausea or vomiting.  Vital signs in last 24 hours: [min-max] current  Temp:  [97.9 F (36.6 C)-98.4 F (36.9 C)] 98.4 F (36.9 C) (12/11 0335) Pulse Rate:  [99-109] 109 (12/11 1352) Resp:  [18-20] 18 (12/11 1352) BP: (167-175)/(91-117) 173/117 (12/11 1352) SpO2:  [99 %-100 %] 100 % (12/11 1352)     Height: 5\' 8"  (172.7 cm) Weight: 70.9 kg BMI (Calculated): 23.77   Physical Exam:  Constitutional: alert, no distress  Respiratory: breathing non-labored at rest  Cardiovascular: regular rate and sinus rhythm  Gastrointestinal: soft, mild tender, distended  Labs:  CBC Latest Ref Rng & Units 07/11/2019 04/13/2019  WBC 4.0 - 10.5 K/uL 12.7(H) 7.9  Hemoglobin 12.0 - 15.0 g/dL 12.9 11.1  Hematocrit 36.0 - 46.0 % 40.4 33.0(L)  Platelets 150 - 400 K/uL 377 441   CMP Latest Ref Rng & Units 07/13/2019 07/12/2019 07/11/2019  Glucose 70 - 99 mg/dL 90 118(H) 129(H)  BUN 6 - 20 mg/dL 10 12 16   Creatinine 0.44 - 1.00 mg/dL 0.77 0.70 0.84  Sodium 135 - 145 mmol/L 140 139 138  Potassium 3.5 - 5.1 mmol/L 3.0(L) 2.9(L) 3.5  Chloride 98 - 111 mmol/L 104 103 98  CO2 22 - 32 mmol/L 25 28 29   Calcium 8.9 - 10.3 mg/dL 9.0 9.0 10.2  Total Protein 6.5 - 8.1 g/dL - 6.4(L) -  Total Bilirubin 0.3 - 1.2 mg/dL - 0.7 -  Alkaline Phos 38 - 126 U/L - 107 -  AST 15 - 41 U/L - 11(L) -  ALT 0 - 44 U/L - 7 -    Imaging studies: I personally evaluated the new abdominal x-ray.  There is persistent small bowel dilation.  No free air under the diaphragm.  I also evaluated the images of the MRI.  There are multiple liver lesions and pulmonary nails consistent with metastatic disease.  There is also a mass on the left side of the abdomen concerning for renal mass versus peritoneal nodes from  metastatic disease.   Assessment/Plan:  53 y.o.femalewith small bowel obstruction, complicated by pertinent comorbidities includinganxiety, history of rectal cancer without completion of adjuvant therapy, now with new finding of pulmonary, liver and retroperitoneal metastatic disease,, anxiety, bipolar 2 disorder, seizures.  Patient will proceed to small bowel obstruction.  Physical exam with distended abdomen.  Abdominal x-ray with small bowel dilation.  Even though the patient reports passing gas clinically the patient is behaving as she has persistent bowel obstruction.  Patient is demanding to have something to eat.  I cannot recommend starting her on diet due to high rates of vomiting and aspiration.  My recommendation is to treat small bowel obstruction with nasogastric tube.  Patient refusing nasogastric tube placement.  Patient oriented about the risk of not having NGT in place including bowel perforation, sepsis, vomiting, aspiration and even death.  I called Ballard Russell who is listed after the patient contact and has requested patient update.  I discussed the new finding of the MRI and the situation that the patient is refusing the medical recommendation for the treatment of small bowel obstruction.  She has been also refusing oncology evaluation.  I explained to her the risk of her not following the recommendations.  From surgery standpoint, my recommendation is  to treat this patient with nasogastric tube, IV fluid, bowel rest and serial physical exams.  I will follow her while inpatient to assist in the management of small bowel obstruction.   Arnold Long, MD
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
## 2019-07-13 NOTE — Progress Notes (Signed)
Progress Note    Heather Turner  [id number] DOB: 1966/02/12  DOA: 07/11/2019 PCP: Valerie Roys, DO        Assessment/Plan:   Active Problems:   SBO (small bowel obstruction) (H[facility])   Body mass index is 23.77 kg/m.   Small bowel obstruction: Keep n.p.o. for now.  She refused NG tube placement and NG tube could not be placed even after she was given Ativan.  Continue IV fluids for hydration.  Analgesics as needed for pain.  Follow-up with general surgeon for further recommendations.  Hypertension: IV labetalol as needed for severe hypertension.  Started amlodipine.  Hypokalemia: Replete potassium  Severe left hydroureteronephrosis: Previously, she had a percutaneous nephrostomy tube for this and was removed in August 2020 per her request.  Patient was seen by the urologist and patient refused to have a nephrostomy tube.  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  I suggested an oncology consult but patient declined and said she does not want to see the oncologist.  She understands that she has cancer.  She said she just wants something to eat.  Consulted palliative care.  She is alert and oriented to person, place, time and situation.  She has the capacity to make her own decisions.  History of bipolar disorder: No longer on medications.     Family Communication/Anticipated D/C date and plan/Code Status   DVT prophylaxis: Lovenox Code Status: Full code Family Communication: None Disposition Plan: To be determined      Subjective:   She said abdominal pain is better and she wants something to eat.  She said she has passed flatus but she still not had a bowel movement.  No vomiting.  Objective:    Vitals:   07/12/19 2201 07/13/19 0335 07/13/19 1352 07/13/19 1658  BP: (!) 167/95 (!) 171/93 (!) 173/117 (!) 145/89  Pulse: 100 99 (!) 109 (!) 110  Resp: 20 18 18 18   Temp: 97.9 F (36.6 C) 98.4 F  (36.9 C)    TempSrc: Oral Oral    SpO2: 99% 100% 100% 100%  Weight:      Height:        Intake/Output Summary (Last 24 hours) at 07/13/2019 1735 Last data filed at 07/13/2019 [id number] Gross per 24 hour  Intake 740.18 ml  Output --  Net 740.18 ml   Filed Weights   07/12/19 0523  Weight: 70.9 kg    Exam:  GEN: NAD SKIN: No rash EYES: no pallor or icterus ENT: MMM CV: RRR PULM: CTA B ABD: soft, ND, NT, +BS CNS: AAO x 3, non focal EXT: No edema or tenderness   Data Reviewed:   I have personally reviewed following labs and imaging studies:  Labs: Labs show the following:   Basic Metabolic Panel: Recent Labs  Lab 07/11/19 0951 07/12/19 0442 07/13/19 0419  NA 138 139 140  K 3.5 2.9* 3.0*  CL 98 103 104  CO2 29 28 25   GLUCOSE 129* 118* 90  BUN 16 12 10   CREATININE 0.84 0.70 0.77  CALCIUM 10.2 9.0 9.0  MG  --  2.2  --   PHOS  --  3.3  --    GFR Estimated Creatinine Clearance: 84.9 mL/min (by C-G formula based on SCr of 0.77 mg/dL). Liver Function Tests: Recent Labs  Lab 07/12/19 0442  AST 11*  ALT 7  ALKPHOS 107  BILITOT 0.7  PROT 6.4*  ALBUMIN 3.2*  No results for input(s): LIPASE, AMYLASE in the last 168 hours. No results for input(s): AMMONIA in the last 168 hours. Coagulation profile No results for input(s): INR, PROTIME in the last 168 hours.  CBC: Recent Labs  Lab 07/11/19 0951  WBC 12.7*  HGB 12.9  HCT 40.4  MCV 85.8  PLT 377   Cardiac Enzymes: No results for input(s): CKTOTAL, CKMB, CKMBINDEX, TROPONINI in the last 168 hours. BNP (last 3 results) No results for input(s): PROBNP in the last 8760 hours. CBG: No results for input(s): GLUCAP in the last 168 hours. D-Dimer: No results for input(s): DDIMER in the last 72 hours. Hgb A1c: No results for input(s): HGBA1C in the last 72 hours. Lipid Profile: No results for input(s): CHOL, HDL, LDLCALC, TRIG, CHOLHDL, LDLDIRECT in the last 72 hours. Thyroid function studies: No  results for input(s): TSH, T4TOTAL, T3FREE, THYROIDAB in the last 72 hours.  Invalid input(s): FREET3 Anemia work up: No results for input(s): VITAMINB12, FOLATE, FERRITIN, TIBC, IRON, RETICCTPCT in the last 72 hours. Sepsis Labs: Recent Labs  Lab 07/11/19 0951  WBC 12.7*    Microbiology Recent Results (from the past 240 hour(s))  Urine culture     Status: Abnormal   Collection Time: 07/11/19 10:04 AM   Specimen: Urine, Random  Result Value Ref Range Status   Specimen Description   Final    URINE, RANDOM Performed at [facility], [address], [place], [place] [address]    Special Requests   Final    NONE Performed at [facility], [address] [place], [place] [address]    Culture MULTIPLE SPECIES PRESENT, SUGGEST RECOLLECTION (A)  Final   Report Status 07/12/2019 FINAL  Final  Respiratory Panel by RT PCR (Flu A&B, Covid) - Nasopharyngeal Swab     Status: None   Collection Time: 07/11/19  2:20 PM   Specimen: Nasopharyngeal Swab  Result Value Ref Range Status   SARS Coronavirus 2 by RT PCR NEGATIVE NEGATIVE Final    Comment: (NOTE) SARS-CoV-2 target nucleic acids are NOT DETECTED. The SARS-CoV-2 RNA is generally detectable in upper respiratoy specimens during the acute phase of infection. The lowest concentration of SARS-CoV-2 viral copies this assay can detect is 131 copies/mL. A negative result does not preclude SARS-Cov-2 infection and should not be used as the sole basis for treatment or other patient management decisions. A negative result may occur with  improper specimen collection/handling, submission of specimen other than nasopharyngeal swab, presence of viral mutation(s) within the areas targeted by this assay, and inadequate number of viral copies (<131 copies/mL). A negative result must be combined with clinical observations, patient history, and epidemiological information. The expected result is Negative. Fact Sheet for Patients:   PinkCheek.be Fact Sheet for Healthcare Providers:  GravelBags.it This test is not yet ap proved or cleared by the [place] FDA and  has been authorized for detection and/or diagnosis of SARS-CoV-2 by FDA under an Emergency Use Authorization (EUA). This EUA will remain  in effect (meaning this test can be used) for the duration of the COVID-19 declaration under Section 564(b)(1) of the Act, 21 U.S.C. section 360bbb-3(b)(1), unless the authorization is terminated or revoked sooner.    Influenza A by PCR NEGATIVE NEGATIVE Final   Influenza B by PCR NEGATIVE NEGATIVE Final    Comment: (NOTE) The Xpert Xpress SARS-CoV-2/FLU/RSV assay is intended as an aid in  the diagnosis of influenza from Nasopharyngeal swab specimens and  should not be used as  a sole basis for treatment. Nasal washings and  aspirates are unacceptable for Xpert Xpress SARS-CoV-2/FLU/RSV  testing. Fact Sheet for Patients: PinkCheek.be Fact Sheet for Healthcare Providers: GravelBags.it This test is not yet approved or cleared by the [place] FDA and  has been authorized for detection and/or diagnosis of SARS-CoV-2 by  FDA under an Emergency Use Authorization (EUA). This EUA will remain  in effect (meaning this test can be used) for the duration of the  Covid-19 declaration under Section 564(b)(1) of the Act, 21  U.S.C. section 360bbb-3(b)(1), unless the authorization is  terminated or revoked. Performed at [facility], [address] [place], [place] [address]     Procedures and diagnostic studies:  DG Skull 1-3 Views  Result Date: 07/11/2019 CLINICAL DATA:  MRI clearance EXAM: SKULL - 1-3 VIEW COMPARISON:  None. FINDINGS: Changes consistent with dental fillings are noted. Prior fixation sideplate is noted in the anterior aspect of right mandible. No radiopaque foreign bodies are  noted within the orbits. No other focal abnormality is seen. IMPRESSION: Surgical fixation sideplate in the right mandible. No metallic foreign bodies are noted. Electronically Signed   By: Inez Catalina M.D.   On: 07/11/2019 18:03   MR LIVER W WO CONTRAST  Result Date: 07/13/2019 CLINICAL DATA:  Evaluate liver lesions seen on recent CT scan. EXAM: MRI ABDOMEN WITHOUT AND WITH CONTRAST TECHNIQUE: Multiplanar multisequence MR imaging of the abdomen was performed both before and after the administration of intravenous contrast. CONTRAST:  65mL GADAVIST GADOBUTROL 1 MMOL/ML IV SOLN COMPARISON:  CT scan 07/11/2019 FINDINGS: Lower chest: Small basilar pulmonary nodules again noted. The heart is normal in size. No pericardial effusion. No pleural effusion. Hepatobiliary: There are at least 5 small liver lesions which demonstrate slight increased T2 signal intensity and rim enhancement after contrast. Findings are most consistent with hepatic metastasis. The lesions are hard to measure on the postcontrast images because of motion artifact. Measuring on series 3, the segment 3 lesion measures 11 mm. The caudate lesion also measures 11 mm. A segment 5 lesion on image 16 measures 8.5 mm and a segment 6 lesion on image 16 measures 8 mm. Moderate gallbladder distention but no definite gallstones. The common bile duct measures a maximum of 9 mm but no common bile duct stones, ampullary lesion or pancreatic head mass. Pancreas:  No mass, inflammation or ductal dilatation. Spleen: Indeterminate 11 mm nodule in the medial aspect of the spleen. Adrenals/Urinary Tract:  The adrenal glands are unremarkable. Persistent marked left-sided hydronephrosis with probable prior nephrostomy tube with scarring change in the left flank adjacent to the kidney. Heterogeneous enhancing lesion in the renal pelvis worrisome for tumor. There is also a heterogeneously enhancing soft tissue mass in the retroperitoneum adjacent to the aorta measuring  3.5 x 2.9 cm and consistent with obstructing tumor. The right kidney is unremarkable. Stomach/Bowel: The stomach and duodenum are unremarkable. The visualized small bowel is mildly dilated. Mild colonic distention with air is also noted. Vascular/Lymphatic: The aorta and branch vessels are patent. The major venous structures are patent. There are numerous small omental nodules consistent with peritoneal carcinomatosis. Other:  Mild mesenteric edema but no overt ascites. Musculoskeletal: No significant bony findings. No obvious metastatic disease. IMPRESSION: 1. MR findings consistent with diffuse metastatic disease. There are lung nodules, at least 5 liver lesions, peritoneal and omental nodules, a left para-aortic mass obstructing the left ureter and probable tumor in the left renal collecting system. 2. Mild gallbladder distension and mild  common bile duct dilatation but no obstructing biliary calculi, ampullary lesion or pancreatic head mass. 3. Indeterminate splenic lesion. Electronically Signed   By: Marijo Sanes M.D.   On: 07/13/2019 08:22   DG Abd 2 Views  Result Date: 07/13/2019 CLINICAL DATA:  Small-bowel obstruction EXAM: ABDOMEN - 2 VIEW COMPARISON:  07/12/2019 FINDINGS: Atelectasis is noted at the lung base on the left. No signs of free air. Persistent dilation of small bowel throughout the central abdomen. Abundant stool in the ascending colon. No acute bone process. IMPRESSION: 1. Persistent dilation of small bowel throughout the central abdomen., signs of small bowel obstruction, perhaps partial, still with abundant stool in the ascending colon. 2. Atelectasis at the left lung base. 3. Abundant stool in the ascending colon. Electronically Signed   By: Zetta Bills M.D.   On: 07/13/2019 10:11   DG Abd 2 Views  Result Date: 07/12/2019 CLINICAL DATA:  Dysuria with foul-smelling urine. Lower abdominal pain and back pain. EXAM: ABDOMEN - 2 VIEW COMPARISON:  CT abdomen/pelvis 07/11/2019  FINDINGS: Examination demonstrates multiple centralized air-filled dilated small bowel loops measuring up to 3.5 cm in diameter as seen on patient's recent CT scan. No evidence of free peritoneal air. Few scattered air-fluid levels are present. Air and stool present within the colon with moderate fecal retention over the right colon. Mild degenerative change of the spine. IMPRESSION: Air-filled dilated small bowel loops compatible with patient's recent CT findings of small bowel obstruction. No free peritoneal air. Electronically Signed   By: Marin Olp M.D.   On: 07/12/2019 11:12    Medications:    amLODipine  5 mg Oral Daily   enoxaparin (LOVENOX) injection  40 mg Subcutaneous Q24H   Continuous Infusions:  dextrose 5 % and 0.9 % NaCl with KCl 40 mEq/L 100 mL/hr at 07/13/19 1633     LOS: 1 day   Audley Hinojos  [facility]   *Please refer to [email or web address], password [email or web address] to get updated schedule on who will round on this patient, as hospitalists switch teams weekly. If 7PM-7AM, please contact night-coverage at [email or web address], password [email or web address] for any overnight needs.  07/13/2019, 5:35 PM

## 2019-07-14 DIAGNOSIS — C799 Secondary malignant neoplasm of unspecified site: Secondary | ICD-10-CM

## 2019-07-14 LAB — POTASSIUM: Potassium: 4.2 mmol/L (ref 3.5–5.1)

## 2019-07-14 NOTE — Plan of Care (Signed)
Patient tolerated clear liquids and reports passing gas.  Pain controlled with PO meds adequately per patient report. Ambulating independently in hallway.

## 2019-07-14 NOTE — Progress Notes (Signed)
Progress Note    Heather Turner  [id number] DOB: Oct 21, 1965  DOA: 07/11/2019 PCP: Valerie Roys, DO        Assessment/Plan:   Active Problems:   SBO (small bowel obstruction) (HCC)   Metastasis from rectal cancer ([place])   Goals of care, counseling/discussion   Palliative care encounter   Body mass index is 23.77 kg/m.   Small bowel obstruction: She has been started on clear liquid diet.  Discontinue IV fluids.  Analgesics as needed for pain.  Follow-up with general surgeon for further recommendations.  Hypertension: IV labetalol as needed for severe hypertension. Continue amlodipine.  Hypokalemia: Improved  Severe left hydroureteronephrosis: Previously, she had a percutaneous nephrostomy tube for this and was removed in August 2020 per her request.  Patient was seen by the urologist and patient refused to have a new nephrostomy tube.  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  She refuses further evaluation and treatment  History of bipolar disorder: No longer on medications.     Family Communication/Anticipated D/C date and plan/Code Status   DVT prophylaxis: Lovenox Code Status: Full code Family Communication: None Disposition Plan: To be determined      Subjective:   She has no complaints.  She said her abdominal pain is better.  She is passing gas but she has not moved her bowels.  No vomiting.   Objective:    Vitals:   07/13/19 2051 07/14/19 0552 07/14/19 1410 07/14/19 2044  BP: (!) 138/94 (!) 148/82 (!) 160/86 (!) 170/87  Pulse: (!) 104 92 (!) 102 89  Resp: 20 20  20   Temp: 98.2 F (36.8 C) 97.8 F (36.6 C) 97.9 F (36.6 C) 98.2 F (36.8 C)  TempSrc: Oral Oral Oral Oral  SpO2: 97% 99% 100% 100%  Weight:      Height:        Intake/Output Summary (Last 24 hours) at 07/14/2019 2137 Last data filed at 07/14/2019 1503 Gross per 24 hour  Intake 3181.18 ml  Output --   Net 3181.18 ml   Filed Weights   07/12/19 0523  Weight: 70.9 kg    Exam:  GEN: NAD SKIN: No rash EYES: no pallor, anicteric ENT: MMM CV: RRR PULM: CTA B ABD: soft, ND, NT, +BS CNS: AAO x 3, non focal EXT: No edema or tenderness    Data Reviewed:   I have personally reviewed following labs and imaging studies:  Labs: Labs show the following:   Basic Metabolic Panel: Recent Labs  Lab 07/11/19 0951 07/12/19 0442 07/13/19 0419 07/14/19 0612  NA 138 139 140  --   K 3.5 2.9* 3.0* 4.2  CL 98 103 104  --   CO2 29 28 25   --   GLUCOSE 129* 118* 90  --   BUN 16 12 10   --   CREATININE 0.84 0.70 0.77  --   CALCIUM 10.2 9.0 9.0  --   MG  --  2.2  --   --   PHOS  --  3.3  --   --    GFR Estimated Creatinine Clearance: 84.9 mL/min (by C-G formula based on SCr of 0.77 mg/dL). Liver Function Tests: Recent Labs  Lab 07/12/19 0442  AST 11*  ALT 7  ALKPHOS 107  BILITOT 0.7  PROT 6.4*  ALBUMIN 3.2*   No results for input(s): LIPASE, AMYLASE in the last 168 hours. No results for input(s): AMMONIA in the  last 168 hours. Coagulation profile No results for input(s): INR, PROTIME in the last 168 hours.  CBC: Recent Labs  Lab 07/11/19 0951  WBC 12.7*  HGB 12.9  HCT 40.4  MCV 85.8  PLT 377   Cardiac Enzymes: No results for input(s): CKTOTAL, CKMB, CKMBINDEX, TROPONINI in the last 168 hours. BNP (last 3 results) No results for input(s): PROBNP in the last 8760 hours. CBG: No results for input(s): GLUCAP in the last 168 hours. D-Dimer: No results for input(s): DDIMER in the last 72 hours. Hgb A1c: No results for input(s): HGBA1C in the last 72 hours. Lipid Profile: No results for input(s): CHOL, HDL, LDLCALC, TRIG, CHOLHDL, LDLDIRECT in the last 72 hours. Thyroid function studies: No results for input(s): TSH, T4TOTAL, T3FREE, THYROIDAB in the last 72 hours.  Invalid input(s): FREET3 Anemia work up: No results for input(s): VITAMINB12, FOLATE, FERRITIN,  TIBC, IRON, RETICCTPCT in the last 72 hours. Sepsis Labs: Recent Labs  Lab 07/11/19 0951  WBC 12.7*    Microbiology Recent Results (from the past 240 hour(s))  Urine culture     Status: Abnormal   Collection Time: 07/11/19 10:04 AM   Specimen: Urine, Random  Result Value Ref Range Status   Specimen Description   Final    URINE, RANDOM Performed at [facility], [address], [place], [place] [address]    Special Requests   Final    NONE Performed at [facility], [address] [place], [place] [address]    Culture MULTIPLE SPECIES PRESENT, SUGGEST RECOLLECTION (A)  Final   Report Status 07/12/2019 FINAL  Final  Respiratory Panel by RT PCR (Flu A&B, Covid) - Nasopharyngeal Swab     Status: None   Collection Time: 07/11/19  2:20 PM   Specimen: Nasopharyngeal Swab  Result Value Ref Range Status   SARS Coronavirus 2 by RT PCR NEGATIVE NEGATIVE Final    Comment: (NOTE) SARS-CoV-2 target nucleic acids are NOT DETECTED. The SARS-CoV-2 RNA is generally detectable in upper respiratoy specimens during the acute phase of infection. The lowest concentration of SARS-CoV-2 viral copies this assay can detect is 131 copies/mL. A negative result does not preclude SARS-Cov-2 infection and should not be used as the sole basis for treatment or other patient management decisions. A negative result may occur with  improper specimen collection/handling, submission of specimen other than nasopharyngeal swab, presence of viral mutation(s) within the areas targeted by this assay, and inadequate number of viral copies (<131 copies/mL). A negative result must be combined with clinical observations, patient history, and epidemiological information. The expected result is Negative. Fact Sheet for Patients:  PinkCheek.be Fact Sheet for Healthcare Providers:  GravelBags.it This test is not yet ap proved or cleared by the  [place] FDA and  has been authorized for detection and/or diagnosis of SARS-CoV-2 by FDA under an Emergency Use Authorization (EUA). This EUA will remain  in effect (meaning this test can be used) for the duration of the COVID-19 declaration under Section 564(b)(1) of the Act, 21 U.S.C. section 360bbb-3(b)(1), unless the authorization is terminated or revoked sooner.    Influenza A by PCR NEGATIVE NEGATIVE Final   Influenza B by PCR NEGATIVE NEGATIVE Final    Comment: (NOTE) The Xpert Xpress SARS-CoV-2/FLU/RSV assay is intended as an aid in  the diagnosis of influenza from Nasopharyngeal swab specimens and  should not be used as a sole basis for treatment. Nasal washings and  aspirates are unacceptable for Xpert Xpress SARS-CoV-2/FLU/RSV  testing.  Fact Sheet for Patients: PinkCheek.be Fact Sheet for Healthcare Providers: GravelBags.it This test is not yet approved or cleared by the [place] FDA and  has been authorized for detection and/or diagnosis of SARS-CoV-2 by  FDA under an Emergency Use Authorization (EUA). This EUA will remain  in effect (meaning this test can be used) for the duration of the  Covid-19 declaration under Section 564(b)(1) of the Act, 21  U.S.C. section 360bbb-3(b)(1), unless the authorization is  terminated or revoked. Performed at [facility], [address] [place], [place] [address]     Procedures and diagnostic studies:  MR LIVER W WO CONTRAST  Result Date: 07/13/2019 CLINICAL DATA:  Evaluate liver lesions seen on recent CT scan. EXAM: MRI ABDOMEN WITHOUT AND WITH CONTRAST TECHNIQUE: Multiplanar multisequence MR imaging of the abdomen was performed both before and after the administration of intravenous contrast. CONTRAST:  44mL GADAVIST GADOBUTROL 1 MMOL/ML IV SOLN COMPARISON:  CT scan 07/11/2019 FINDINGS: Lower chest: Small basilar pulmonary nodules again noted. The heart  is normal in size. No pericardial effusion. No pleural effusion. Hepatobiliary: There are at least 5 small liver lesions which demonstrate slight increased T2 signal intensity and rim enhancement after contrast. Findings are most consistent with hepatic metastasis. The lesions are hard to measure on the postcontrast images because of motion artifact. Measuring on series 3, the segment 3 lesion measures 11 mm. The caudate lesion also measures 11 mm. A segment 5 lesion on image 16 measures 8.5 mm and a segment 6 lesion on image 16 measures 8 mm. Moderate gallbladder distention but no definite gallstones. The common bile duct measures a maximum of 9 mm but no common bile duct stones, ampullary lesion or pancreatic head mass. Pancreas:  No mass, inflammation or ductal dilatation. Spleen: Indeterminate 11 mm nodule in the medial aspect of the spleen. Adrenals/Urinary Tract:  The adrenal glands are unremarkable. Persistent marked left-sided hydronephrosis with probable prior nephrostomy tube with scarring change in the left flank adjacent to the kidney. Heterogeneous enhancing lesion in the renal pelvis worrisome for tumor. There is also a heterogeneously enhancing soft tissue mass in the retroperitoneum adjacent to the aorta measuring 3.5 x 2.9 cm and consistent with obstructing tumor. The right kidney is unremarkable. Stomach/Bowel: The stomach and duodenum are unremarkable. The visualized small bowel is mildly dilated. Mild colonic distention with air is also noted. Vascular/Lymphatic: The aorta and branch vessels are patent. The major venous structures are patent. There are numerous small omental nodules consistent with peritoneal carcinomatosis. Other:  Mild mesenteric edema but no overt ascites. Musculoskeletal: No significant bony findings. No obvious metastatic disease. IMPRESSION: 1. MR findings consistent with diffuse metastatic disease. There are lung nodules, at least 5 liver lesions, peritoneal and omental  nodules, a left para-aortic mass obstructing the left ureter and probable tumor in the left renal collecting system. 2. Mild gallbladder distension and mild common bile duct dilatation but no obstructing biliary calculi, ampullary lesion or pancreatic head mass. 3. Indeterminate splenic lesion. Electronically Signed   By: Marijo Sanes M.D.   On: 07/13/2019 08:22   DG Abd 2 Views  Result Date: 07/13/2019 CLINICAL DATA:  Small-bowel obstruction EXAM: ABDOMEN - 2 VIEW COMPARISON:  07/12/2019 FINDINGS: Atelectasis is noted at the lung base on the left. No signs of free air. Persistent dilation of small bowel throughout the central abdomen. Abundant stool in the ascending colon. No acute bone process. IMPRESSION: 1. Persistent dilation of small bowel throughout the central abdomen., signs of  small bowel obstruction, perhaps partial, still with abundant stool in the ascending colon. 2. Atelectasis at the left lung base. 3. Abundant stool in the ascending colon. Electronically Signed   By: Zetta Bills M.D.   On: 07/13/2019 10:11    Medications:   . amLODipine  5 mg Oral Daily  . enoxaparin (LOVENOX) injection  40 mg Subcutaneous Q24H   Continuous Infusions:    LOS: 2 days   Ayesha Markwell  [facility]   *Please refer to [email or web address], password [email or web address] to get updated schedule on who will round on this patient, as hospitalists switch teams weekly. If 7PM-7AM, please contact night-coverage at [email or web address], password [email or web address] for any overnight needs.  07/14/2019, 9:37 PM

## 2019-07-14 NOTE — Progress Notes (Signed)
[facility] Hospital Day(s): 2.   Post op day(s):  Marland Kitchen   Interval History: Patient seen and examined, no acute events or new complaints overnight. Patient reports passing a lot of gas. Actually patient did pass flatus at the moment of my evaluation. Denies nausea and vomiting. Denies abdominal pain. No pain radiation. No alleviating or aggravating factor.   Vital signs in last 24 hours: [min-max] current  Temp:  [97.8 F (36.6 C)-98.2 F (36.8 C)] 97.8 F (36.6 C) (12/12 0552) Pulse Rate:  [92-110] 92 (12/12 0552) Resp:  [18-20] 20 (12/12 0552) BP: (138-173)/(82-117) 148/82 (12/12 0552) SpO2:  [97 %-100 %] 99 % (12/12 0552)     Height: 5\' 8"  (172.7 cm) Weight: 70.9 kg BMI (Calculated): 23.77   Physical Exam:  Constitutional: alert, cooperative and no distress  Respiratory: breathing non-labored at rest  Cardiovascular: regular rate and sinus rhythm  Gastrointestinal: soft, non-tender, and non-distended  Labs:  CBC Latest Ref Rng & Units 07/11/2019 04/13/2019  WBC 4.0 - 10.5 K/uL 12.7(H) 7.9  Hemoglobin 12.0 - 15.0 g/dL 12.9 11.1  Hematocrit 36.0 - 46.0 % 40.4 33.0(L)  Platelets 150 - 400 K/uL 377 441   CMP Latest Ref Rng & Units 07/14/2019 07/13/2019 07/12/2019  Glucose 70 - 99 mg/dL - 90 118(H)  BUN 6 - 20 mg/dL - 10 12  Creatinine 0.44 - 1.00 mg/dL - 0.77 0.70  Sodium 135 - 145 mmol/L - 140 139  Potassium 3.5 - 5.1 mmol/L 4.2 3.0(L) 2.9(L)  Chloride 98 - 111 mmol/L - 104 103  CO2 22 - 32 mmol/L - 25 28  Calcium 8.9 - 10.3 mg/dL - 9.0 9.0  Total Protein 6.5 - 8.1 g/dL - - 6.4(L)  Total Bilirubin 0.3 - 1.2 mg/dL - - 0.7  Alkaline Phos 38 - 126 U/L - - 107  AST 15 - 41 U/L - - 11(L)  ALT 0 - 44 U/L - - 7    Imaging studies: No new pertinent imaging studies   Assessment/Plan:  53 y.o.femalewith small bowel obstruction, complicated by pertinent comorbidities includinganxiety, history of rectal cancer without completion of adjuvant therapy, now with new  finding of pulmonary, liver and retroperitoneal metastatic disease, anxiety, bipolar 2 disorder, seizures.  Patient with improved abdominal distention. The abdomen is soft and passing gas. Will start clear liquid and assess for toleration. If tolerates and continue to pass gas, may advance as tolerated.   Arnold Long, MD

## 2019-07-15 MED ORDER — ACETAMINOPHEN 325 MG PO TABS: 650.0000 mg | ORAL_TABLET | Freq: Four times a day (QID) | ORAL | Status: DC | PRN

## 2019-07-15 MED ORDER — AMLODIPINE BESYLATE 10 MG PO TABS
10.0000 mg | ORAL_TABLET | Freq: Every day | ORAL | Status: DC
  Administered 2019-07-15: 10 mg via ORAL
  Filled 2019-07-15: qty 1

## 2019-07-15 MED ORDER — AMLODIPINE BESYLATE 10 MG PO TABS: 10.0000 mg | ORAL_TABLET | Freq: Every day | ORAL | 0 refills | Status: DC
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
## 2019-07-15 NOTE — Progress Notes (Signed)
[place] Hospital Day(s): 3.   Post op day(s):  Marland Kitchen   Interval History: Patient seen and examined, no acute events or new complaints overnight. Patient reports having back pain on the left side of her back that radiates to her left leg.  Denies nausea or vomiting.  Reports passing gas.  Vital signs in last 24 hours: [min-max] current  Temp:  [97.9 F (36.6 C)-98.3 F (36.8 C)] 98.3 F (36.8 C) (12/13 0807) Pulse Rate:  [86-102] 87 (12/13 0807) Resp:  [18-20] 18 (12/13 0807) BP: (147-170)/(85-95) 147/95 (12/13 0807) SpO2:  [97 %-100 %] 97 % (12/13 0807)     Height: 5\' 8"  (172.7 cm) Weight: 70.9 kg BMI (Calculated): 23.77   Physical Exam:  Constitutional: alert, cooperative and no distress  Respiratory: breathing non-labored at rest  Cardiovascular: regular rate and sinus rhythm  Gastrointestinal: soft, non-tender, and non-distended  Labs:  CBC Latest Ref Rng & Units 07/11/2019 04/13/2019  WBC 4.0 - 10.5 K/uL 12.7(H) 7.9  Hemoglobin 12.0 - 15.0 g/dL 12.9 11.1  Hematocrit 36.0 - 46.0 % 40.4 33.0(L)  Platelets 150 - 400 K/uL 377 441   CMP Latest Ref Rng & Units 07/14/2019 07/13/2019 07/12/2019  Glucose 70 - 99 mg/dL - 90 118(H)  BUN 6 - 20 mg/dL - 10 12  Creatinine 0.44 - 1.00 mg/dL - 0.77 0.70  Sodium 135 - 145 mmol/L - 140 139  Potassium 3.5 - 5.1 mmol/L 4.2 3.0(L) 2.9(L)  Chloride 98 - 111 mmol/L - 104 103  CO2 22 - 32 mmol/L - 25 28  Calcium 8.9 - 10.3 mg/dL - 9.0 9.0  Total Protein 6.5 - 8.1 g/dL - - 6.4(L)  Total Bilirubin 0.3 - 1.2 mg/dL - - 0.7  Alkaline Phos 38 - 126 U/L - - 107  AST 15 - 41 U/L - - 11(L)  ALT 0 - 44 U/L - - 7    Imaging studies: No new pertinent imaging studies   Assessment/Plan:  53 y.o.femalewith small bowel obstruction, complicated by pertinent comorbidities includinganxiety, history of rectal cancer without completion of adjuvant therapy, now with new finding of pulmonary, liver and retroperitoneal metastatic disease,  anxiety, bipolar 2 disorder, seizures.  Patient tolerated her full liquid diet.  There has been no nausea or vomiting.  Abdomen continues to be soft.  Patient passing gas.  From surgery standpoint patient can be discharged or advance if she is going to stay in the hospital.  None she is complaining of back pain that radiates to the left leg.  This is not related to the bowel obstruction.  I think that this is due to the intra-abdominal metastasis that may be involving nerve structures.  Patient is denying any recurrent of cancer.  Patient denies any problem with her left kidney and ureter.  I will defer this to the hospitalist or urologist.  If patient is discharged she will not need follow-up with surgery.  I recommend follow-up with primary care physician and oncologist.  No need for pain medication prescription.  Arnold Long, MD

## 2019-07-15 NOTE — Progress Notes (Signed)
07/15/2019 5:26 PM  Macarthur Critchley to be D/C'd Home per MD order.  Discussed prescriptions and follow up appointments with the patient. Prescriptions given to patient, medication list explained in detail. Pt verbalized understanding.  Allergies as of 07/15/2019      Reactions   Baclofen    migrane   Latex    Codeine Nausea Only   Penicillin G Nausea And Vomiting      Medication List    STOP taking these medications   BAYER BACK & BODY PO   DANDELION ROOT PO   diclofenac sodium 1 % Gel Commonly known as: VOLTAREN   ELDERBERRY PO   GARLIC PO   HYDROcodone-acetaminophen 5-325 MG tablet Commonly known as: NORCO/VICODIN   lidocaine 5 % Commonly known as: Lidoderm   MAGNESIUM PO   OIL OF OREGANO PO   traZODone 50 MG tablet Commonly known as: DESYREL     TAKE these medications   acetaminophen 325 MG tablet Commonly known as: TYLENOL Take 2 tablets (650 mg total) by mouth every 6 (six) hours as needed for mild pain (or Fever >/= 101). Notes to patient: As needed   amLODipine 10 MG tablet Commonly known as: NORVASC Take 1 tablet (10 mg total) by mouth daily. Start taking on: July 16, 2019 Notes to patient: Morning 07/16/19   BAYER ASPIRIN PO Take by mouth.       Vitals:   07/15/19 0807 07/15/19 1543  BP: (!) 147/95 (!) 169/90  Pulse: 87 99  Resp: 18 18  Temp: 98.3 F (36.8 C) 99 F (37.2 C)  SpO2: 97% 98%    Skin clean, dry and intact without evidence of skin break down, no evidence of skin tears noted. IV catheter discontinued intact. Site without signs and symptoms of complications. Dressing and pressure applied. Pt denies pain at this time. No complaints noted.  An After Visit Summary was printed and given to the patient. Patient escorted via [place], and D/C home via private auto.  Dola Argyle

## 2019-07-16 ENCOUNTER — Telehealth: Payer: Self-pay

## 2019-07-16 NOTE — Telephone Encounter (Signed)
Transition Care Management Follow-up Telephone Call  Date of discharge and from where: 07/15/2019 [facility]   How have you been since you were released from the hospital? "just in a lot of pain "  Any questions or concerns? " Yes , in a lot of pain"  Items Reviewed:  Did the pt receive and understand the discharge instructions provided? Yes   Medications obtained and verified? Yes   Any new allergies since your discharge? No   Dietary orders reviewed? Yes  Do you have support at home? Yes , aunt   Functional Questionnaire: (I = Independent and D = Dependent) ADLs:   Bathing/Dressing- i  Meal Prep- i  Eating- i  Maintaining continence- d  Transferring/Ambulation-d, walker  Managing Meds- i  Follow up appointments reviewed:   PCP Hospital f/u appt confirmed? Yes  Scheduled to see Regino Schultze on 07/17/2019 @2pm    Specialist Hospital f/u appt confirmed? no  Are transportation arrangements needed? No   If their condition worsens, is the pt aware to call PCP or go to the Emergency Dept.? Yes  Was the patient provided with contact information for the PCP's office or ED? Yes  Was to pt encouraged to call back with questions or concerns? Yes

## 2019-07-17 ENCOUNTER — Encounter: Payer: Self-pay | Admitting: Unknown Physician Specialty

## 2019-07-17 ENCOUNTER — Ambulatory Visit (INDEPENDENT_AMBULATORY_CARE_PROVIDER_SITE_OTHER): Payer: Medicare Other | Admitting: Family Medicine

## 2019-07-17 ENCOUNTER — Other Ambulatory Visit: Payer: Self-pay

## 2019-07-17 DIAGNOSIS — C20 Malignant neoplasm of rectum: Secondary | ICD-10-CM

## 2019-07-17 DIAGNOSIS — I10 Essential (primary) hypertension: Secondary | ICD-10-CM

## 2019-07-17 DIAGNOSIS — K56609 Unspecified intestinal obstruction, unspecified as to partial versus complete obstruction: Secondary | ICD-10-CM

## 2019-07-17 DIAGNOSIS — R918 Other nonspecific abnormal finding of lung field: Secondary | ICD-10-CM | POA: Diagnosis not present

## 2019-07-17 DIAGNOSIS — N1330 Unspecified hydronephrosis: Secondary | ICD-10-CM

## 2019-07-17 DIAGNOSIS — I700 Atherosclerosis of aorta: Secondary | ICD-10-CM

## 2019-07-17 DIAGNOSIS — C799 Secondary malignant neoplasm of unspecified site: Secondary | ICD-10-CM

## 2019-07-17 DIAGNOSIS — R222 Localized swelling, mass and lump, trunk: Secondary | ICD-10-CM

## 2019-07-17 MED ORDER — HYDROCODONE-ACETAMINOPHEN 5-325 MG PO TABS: 1.0000 | ORAL_TABLET | Freq: Two times a day (BID) | ORAL | 0 refills | Status: DC
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
## 2019-07-18 ENCOUNTER — Telehealth: Payer: Self-pay | Admitting: Family Medicine

## 2019-07-18 NOTE — Telephone Encounter (Signed)
[name] case manager calling for a verbal to get pt opened up for hospice care. Please call back  [phone]  Transferring to office for real time.  Jeani Hawking aware will forward message to Dr Wynetta Emery for approval. She is hoping to get out there today to see pt.Heather Turner

## 2019-07-19 NOTE — Telephone Encounter (Signed)
Called and left Heather Turner a VM letting her know that Dr. Wynetta Emery gave the ok for verbal order.

## 2019-07-19 NOTE — Telephone Encounter (Signed)
Please give verbal orders.

## 2019-07-22 ENCOUNTER — Encounter: Payer: Self-pay | Admitting: Family Medicine

## 2019-07-22 NOTE — Assessment & Plan Note (Signed)
Not interested in trying to treat this. Would like to stay comfortable. Would like to start hospice. Referral generated today.

## 2019-07-22 NOTE — Assessment & Plan Note (Signed)
Patient is going to be going on hospice. Continue to monitor.

## 2019-07-22 NOTE — Assessment & Plan Note (Addendum)
Not interested in trying to treat this. Would like to stay comfortable. Would like to start hospice. Referral generated today. Will restart her vicodin BID. Rx sent today.

## 2019-07-22 NOTE — Assessment & Plan Note (Signed)
Started on amlodipine in the hospital. Tolerating well. Continue to monitor.

## 2019-07-22 NOTE — Assessment & Plan Note (Signed)
Not interested in trying to diagnose or treat this. Would like to stay comfortable. Would like to start hospice. Referral generated today.

## 2019-07-23 ENCOUNTER — Telehealth: Payer: Self-pay

## 2019-07-23 ENCOUNTER — Other Ambulatory Visit: Payer: Self-pay

## 2019-07-23 ENCOUNTER — Other Ambulatory Visit: Payer: Medicare Other | Admitting: Adult Health Nurse Practitioner

## 2019-07-23 DIAGNOSIS — C20 Malignant neoplasm of rectum: Secondary | ICD-10-CM

## 2019-07-23 DIAGNOSIS — Z515 Encounter for palliative care: Secondary | ICD-10-CM
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
## 2019-07-23 NOTE — Telephone Encounter (Signed)
Received update from Amy NP that patient was admitted to [facility]

## 2019-08-22 ENCOUNTER — Observation Stay
Admission: EM | Admit: 2019-08-22 | Discharge: 2019-08-22 | Disposition: A | Discharge status: Home / Self Care | Payer: Medicare Other | Attending: Emergency Medicine | Admitting: Emergency Medicine

## 2019-08-22 ENCOUNTER — Emergency Department: Payer: Medicare Other

## 2019-08-22 ENCOUNTER — Other Ambulatory Visit: Payer: Self-pay

## 2019-08-22 DIAGNOSIS — F319 Bipolar disorder, unspecified: Secondary | ICD-10-CM | POA: Diagnosis not present

## 2019-08-22 DIAGNOSIS — Z66 Do not resuscitate: Secondary | ICD-10-CM | POA: Insufficient documentation

## 2019-08-22 DIAGNOSIS — Z91040 Latex allergy status: Secondary | ICD-10-CM | POA: Diagnosis not present

## 2019-08-22 DIAGNOSIS — I700 Atherosclerosis of aorta: Secondary | ICD-10-CM | POA: Diagnosis not present

## 2019-08-22 DIAGNOSIS — R569 Unspecified convulsions: Secondary | ICD-10-CM | POA: Insufficient documentation

## 2019-08-22 DIAGNOSIS — Z885 Allergy status to narcotic agent status: Secondary | ICD-10-CM | POA: Diagnosis not present

## 2019-08-22 DIAGNOSIS — C20 Malignant neoplasm of rectum: Secondary | ICD-10-CM | POA: Insufficient documentation

## 2019-08-22 DIAGNOSIS — K56609 Unspecified intestinal obstruction, unspecified as to partial versus complete obstruction: Secondary | ICD-10-CM | POA: Diagnosis present

## 2019-08-22 DIAGNOSIS — Z87820 Personal history of traumatic brain injury: Secondary | ICD-10-CM | POA: Diagnosis not present

## 2019-08-22 DIAGNOSIS — F17200 Nicotine dependence, unspecified, uncomplicated: Secondary | ICD-10-CM | POA: Diagnosis not present

## 2019-08-22 DIAGNOSIS — N1330 Unspecified hydronephrosis: Secondary | ICD-10-CM | POA: Insufficient documentation

## 2019-08-22 DIAGNOSIS — K56600 Partial intestinal obstruction, unspecified as to cause: Secondary | ICD-10-CM | POA: Insufficient documentation

## 2019-08-22 DIAGNOSIS — C799 Secondary malignant neoplasm of unspecified site: Secondary | ICD-10-CM | POA: Insufficient documentation

## 2019-08-22 DIAGNOSIS — I10 Essential (primary) hypertension: Secondary | ICD-10-CM | POA: Insufficient documentation

## 2019-08-22 DIAGNOSIS — R1084 Generalized abdominal pain: Secondary | ICD-10-CM

## 2019-08-22 DIAGNOSIS — Z79899 Other long term (current) drug therapy: Secondary | ICD-10-CM | POA: Insufficient documentation

## 2019-08-22 DIAGNOSIS — R109 Unspecified abdominal pain: Secondary | ICD-10-CM | POA: Diagnosis present

## 2019-08-22 DIAGNOSIS — Z880 Allergy status to penicillin: Secondary | ICD-10-CM | POA: Diagnosis not present

## 2019-08-22 LAB — COMPREHENSIVE METABOLIC PANEL
ALT: 8 U/L (ref 0–44)
AST: 15 U/L (ref 15–41)
Albumin: 3.3 g/dL — ABNORMAL LOW (ref 3.5–5.0)
Alkaline Phosphatase: 142 U/L — ABNORMAL HIGH (ref 38–126)
Anion gap: 9 (ref 5–15)
BUN: 13 mg/dL (ref 6–20)
CO2: 32 mmol/L (ref 22–32)
Calcium: 9.6 mg/dL (ref 8.9–10.3)
Chloride: 98 mmol/L (ref 98–111)
Creatinine, Ser: 0.78 mg/dL (ref 0.44–1.00)
GFR calc Af Amer: >60 mL/min (ref >60)
GFR calc non Af Amer: >60 mL/min (ref >60)
Glucose, Bld: 104 mg/dL — ABNORMAL HIGH (ref 70–99)
Potassium: 3.6 mmol/L (ref 3.5–5.1)
Sodium: 139 mmol/L (ref 135–145)
Total Bilirubin: 0.8 mg/dL (ref 0.3–1.2)
Total Protein: 7.2 g/dL (ref 6.5–8.1)

## 2019-08-22 LAB — CBC WITH DIFFERENTIAL/PLATELET
Abs Immature Granulocytes: 0.16 10*3/uL — ABNORMAL HIGH (ref 0.00–0.07)
Basophils Absolute: 0 10*3/uL (ref 0.0–0.1)
Basophils Relative: 0 %
Eosinophils Absolute: 0.2 10*3/uL (ref 0.0–0.5)
Eosinophils Relative: 2 %
HCT: 39.3 % (ref 36.0–46.0)
Hemoglobin: 12.3 g/dL (ref 12.0–15.0)
Immature Granulocytes: 2 %
Lymphocytes Relative: 11 %
Lymphs Abs: 0.9 10*3/uL (ref 0.7–4.0)
MCH: 26.7 pg (ref 26.0–34.0)
MCHC: 31.3 g/dL (ref 30.0–36.0)
MCV: 85.2 fL (ref 80.0–100.0)
Monocytes Absolute: 0.6 10*3/uL (ref 0.1–1.0)
Monocytes Relative: 7 %
Neutro Abs: 6.3 10*3/uL (ref 1.7–7.7)
Neutrophils Relative %: 78 %
Platelets: 377 10*3/uL (ref 150–400)
RBC: 4.61 MIL/uL (ref 3.87–5.11)
RDW: 17.2 % — ABNORMAL HIGH (ref 11.5–15.5)
WBC: 8.2 10*3/uL (ref 4.0–10.5)
nRBC: 0 % (ref 0.0–0.2)

## 2019-08-22 LAB — URINALYSIS, COMPLETE (UACMP) WITH MICROSCOPIC
Bacteria, UA: NONE SEEN
Bilirubin Urine: NEGATIVE
Glucose, UA: NEGATIVE mg/dL
Hgb urine dipstick: NEGATIVE
Ketones, ur: NEGATIVE mg/dL
Leukocytes,Ua: NEGATIVE
Nitrite: NEGATIVE
Protein, ur: NEGATIVE mg/dL
Specific Gravity, Urine: 1.026 (ref 1.005–1.030)
pH: 7 (ref 5.0–8.0)

## 2019-08-22 LAB — LACTIC ACID, PLASMA: Lactic Acid, Venous: 1 mmol/L (ref 0.5–1.9)

## 2019-08-22 LAB — MAGNESIUM: Magnesium: 2.2 mg/dL (ref 1.7–2.4)

## 2019-08-22 LAB — LIPASE, BLOOD: Lipase: 26 U/L (ref 11–51)

## 2019-08-22 MED ORDER — SODIUM CHLORIDE 0.9 % IV BOLUS
500.0000 mL | Freq: Once | INTRAVENOUS | Status: AC
  Administered 2019-08-22: 500 mL via INTRAVENOUS

## 2019-08-22 MED ORDER — PANTOPRAZOLE SODIUM 40 MG IV SOLR: 40.0000 mg | Freq: Two times a day (BID) | INTRAVENOUS | Status: DC

## 2019-08-22 MED ORDER — MORPHINE SULFATE ER 15 MG PO TBCR
15.0000 mg | EXTENDED_RELEASE_TABLET | Freq: Two times a day (BID) | ORAL | Status: DC
  Filled 2019-08-22: qty 1

## 2019-08-22 MED ORDER — IOHEXOL 300 MG/ML  SOLN
100.0000 mL | Freq: Once | INTRAMUSCULAR | Status: AC | PRN
  Administered 2019-08-22: 100 mL via INTRAVENOUS

## 2019-08-22 MED ORDER — HYDROCODONE-ACETAMINOPHEN 10-325 MG PO TABS
2.0000 | ORAL_TABLET | Freq: Once | ORAL | Status: AC
  Administered 2019-08-22: 2 via ORAL
  Filled 2019-08-22: qty 2

## 2019-08-22 MED ORDER — ONDANSETRON HCL 4 MG/2ML IJ SOLN
4.0000 mg | Freq: Once | INTRAMUSCULAR | Status: AC
  Administered 2019-08-22: 4 mg via INTRAVENOUS
  Filled 2019-08-22: qty 2

## 2019-08-22 MED ORDER — HYDROMORPHONE HCL 1 MG/ML IJ SOLN: 0.5000 mg | INTRAMUSCULAR | Status: DC | PRN

## 2019-08-22 MED ORDER — HYDROMORPHONE HCL 1 MG/ML IJ SOLN
1.0000 mg | Freq: Once | INTRAMUSCULAR | Status: AC | PRN
  Administered 2019-08-22: 1 mg via INTRAVENOUS
  Filled 2019-08-22: qty 1

## 2019-08-22 MED ORDER — ONDANSETRON HCL 4 MG/2ML IJ SOLN: 4.0000 mg | Freq: Four times a day (QID) | INTRAMUSCULAR | Status: DC | PRN

## 2019-08-22 MED ORDER — HYDROMORPHONE HCL 1 MG/ML IJ SOLN
1.0000 mg | Freq: Once | INTRAMUSCULAR | Status: AC
  Administered 2019-08-22: 1 mg via INTRAVENOUS
  Filled 2019-08-22: qty 1

## 2019-08-22 NOTE — ED Notes (Signed)
Spoke w/ pt and let her know I had a dose of MS contin that I was waiting on before I could discharge her. Pt asked "is it an injection" I told her it was an oral pill and she instructed me to go ahead and call her aunt to pick her up. Called Sonda Rumble to come and p/u. Aunt said she would come in 20 minutes.

## 2019-08-22 NOTE — ED Triage Notes (Signed)
Pt comes into the ED via EMS from home with c/o worsening mid abd pain , states she has stage 4 abd cancer and is under hospice care, takes morphine and hydrocodone for pain but it is not working . Pt is moaning in pain.

## 2019-08-22 NOTE — Discharge Instructions (Addendum)
Your work-up was notable for partial small bowel obstruction and worsening hydronephrosis secondary to your metastatic disease.  We discussed options for surgery, admission for NG tube and pain treatment versus going home on hospice.  You discussed your hospice team and prefer to go home.  They will help you with your pain medication.  They are planning to increase her dose this is.  You can return to the ER if your pain is worsening or you have any other concerns  1. Partial small bowel obstruction. Transition point is in the posterior pelvis and may be due to adhesive disease, local recurrence of rectal carcinoma, or peritoneal carcinomatosis. Additionally, there is new right-sided hydronephrosis. There is a progressive soft tissue lesion within the right posterior pelvis which may involve the distal right ureter. 2. Increase in size of tumor within the left renal pelvis with persistent hydronephrosis. 3. Progression of liver metastasis. 4. Progression of peritoneal nodularity. 5. Increase in size of nodal mass within the left common iliac region extends into the left psoas muscle. 6. Mild increase in size of splenic lesions. 7. Increase in size of expansile left lateral eleventh bone metastasis. 8. Stable left lower lobe pulmonary nodule. 9.  Aortic Atherosclerosis (ICD10-I70.0).

## 2019-08-22 NOTE — ED Notes (Signed)
See triage note. Pt states she has been having large solid stools the past several days, denies N/V/D.Marland Kitchen last BM was last night.

## 2019-08-22 NOTE — ED Notes (Signed)
Called pt's hospice nurse to let her know pt was being discharged. I told the nurse that the pt was still in pain but wanted to go home. Pt stated during discharge "you all didn't do anything for me" I stated "ma'am we were going to admit you but you wanted to go home." I then asked the pt if she wanted the MS contin like she takes at home. She stated "that medicine is making me hurt". I asked the pt if she would like me to get the Dr. She said "no I just want to go home" I wheeled the pt out and assisted her into her Aunt's car.

## 2019-08-22 NOTE — ED Notes (Signed)
Pt refuses to have covid swab, tried to explain to the pt is it a simple procedure we do to screen pts for admission, pt still refuses.

## 2019-08-22 NOTE — ED Notes (Signed)
Spoke w/ pt's aunt [name] and she said she would be able to transport pt back home.
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
## 2019-08-22 NOTE — ED Notes (Signed)
Pt transported to CT via stretcher. States her pain has improved.
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
## 2019-08-22 NOTE — ED Notes (Signed)
Pt crying out states her pain has returned. EDP aware and orders initiated.

## 2019-10-16 ENCOUNTER — Inpatient Hospital Stay
Admission: EM | Admit: 2019-10-16 | Discharge: 2019-11-01 | DRG: 683 | Disposition: E | Payer: Medicare Other | Attending: Internal Medicine | Admitting: Internal Medicine

## 2019-10-16 ENCOUNTER — Emergency Department: Payer: Medicare Other

## 2019-10-16 ENCOUNTER — Other Ambulatory Visit: Payer: Self-pay

## 2019-10-16 ENCOUNTER — Inpatient Hospital Stay: Payer: Medicare Other

## 2019-10-16 DIAGNOSIS — K56600 Partial intestinal obstruction, unspecified as to cause: Secondary | ICD-10-CM | POA: Diagnosis present

## 2019-10-16 DIAGNOSIS — F17200 Nicotine dependence, unspecified, uncomplicated: Secondary | ICD-10-CM | POA: Diagnosis present

## 2019-10-16 DIAGNOSIS — C799 Secondary malignant neoplasm of unspecified site: Secondary | ICD-10-CM | POA: Diagnosis not present

## 2019-10-16 DIAGNOSIS — C787 Secondary malignant neoplasm of liver and intrahepatic bile duct: Secondary | ICD-10-CM | POA: Diagnosis present

## 2019-10-16 DIAGNOSIS — E878 Other disorders of electrolyte and fluid balance, not elsewhere classified: Secondary | ICD-10-CM | POA: Diagnosis present

## 2019-10-16 DIAGNOSIS — N179 Acute kidney failure, unspecified: Secondary | ICD-10-CM | POA: Diagnosis not present

## 2019-10-16 DIAGNOSIS — Z79899 Other long term (current) drug therapy: Secondary | ICD-10-CM | POA: Diagnosis not present

## 2019-10-16 DIAGNOSIS — S069X9S Unspecified intracranial injury with loss of consciousness of unspecified duration, sequela: Secondary | ICD-10-CM

## 2019-10-16 DIAGNOSIS — C189 Malignant neoplasm of colon, unspecified: Secondary | ICD-10-CM | POA: Diagnosis present

## 2019-10-16 DIAGNOSIS — Z515 Encounter for palliative care: Secondary | ICD-10-CM | POA: Diagnosis not present

## 2019-10-16 DIAGNOSIS — F3181 Bipolar II disorder: Secondary | ICD-10-CM | POA: Diagnosis present

## 2019-10-16 DIAGNOSIS — I10 Essential (primary) hypertension: Secondary | ICD-10-CM | POA: Diagnosis present

## 2019-10-16 DIAGNOSIS — E871 Hypo-osmolality and hyponatremia: Secondary | ICD-10-CM

## 2019-10-16 DIAGNOSIS — Z66 Do not resuscitate: Secondary | ICD-10-CM | POA: Diagnosis present

## 2019-10-16 DIAGNOSIS — E875 Hyperkalemia: Secondary | ICD-10-CM

## 2019-10-16 DIAGNOSIS — Z85048 Personal history of other malignant neoplasm of rectum, rectosigmoid junction, and anus: Secondary | ICD-10-CM

## 2019-10-16 DIAGNOSIS — Z8249 Family history of ischemic heart disease and other diseases of the circulatory system: Secondary | ICD-10-CM | POA: Diagnosis not present

## 2019-10-16 DIAGNOSIS — Z7189 Other specified counseling: Secondary | ICD-10-CM | POA: Diagnosis not present

## 2019-10-16 DIAGNOSIS — G253 Myoclonus: Secondary | ICD-10-CM | POA: Diagnosis present

## 2019-10-16 DIAGNOSIS — Z79891 Long term (current) use of opiate analgesic: Secondary | ICD-10-CM | POA: Diagnosis not present

## 2019-10-16 DIAGNOSIS — C7951 Secondary malignant neoplasm of bone: Secondary | ICD-10-CM | POA: Diagnosis not present

## 2019-10-16 DIAGNOSIS — Z20822 Contact with and (suspected) exposure to covid-19: Secondary | ICD-10-CM | POA: Diagnosis present

## 2019-10-16 DIAGNOSIS — N1330 Unspecified hydronephrosis: Secondary | ICD-10-CM | POA: Diagnosis not present

## 2019-10-16 DIAGNOSIS — Z9115 Patient's noncompliance with renal dialysis: Secondary | ICD-10-CM

## 2019-10-16 DIAGNOSIS — M549 Dorsalgia, unspecified: Secondary | ICD-10-CM | POA: Diagnosis present

## 2019-10-16 DIAGNOSIS — C20 Malignant neoplasm of rectum: Secondary | ICD-10-CM | POA: Diagnosis not present

## 2019-10-16 DIAGNOSIS — D638 Anemia in other chronic diseases classified elsewhere: Secondary | ICD-10-CM | POA: Diagnosis present

## 2019-10-16 LAB — HEPATIC FUNCTION PANEL
ALT: 11 U/L (ref 0–44)
AST: 9 U/L — ABNORMAL LOW (ref 15–41)
Albumin: 2.2 g/dL — ABNORMAL LOW (ref 3.5–5.0)
Alkaline Phosphatase: 196 U/L — ABNORMAL HIGH (ref 38–126)
Bilirubin, Direct: 0.1 mg/dL (ref 0.0–0.2)
Indirect Bilirubin: 0.8 mg/dL (ref 0.3–0.9)
Total Bilirubin: 0.9 mg/dL (ref 0.3–1.2)
Total Protein: 6.4 g/dL — ABNORMAL LOW (ref 6.5–8.1)

## 2019-10-16 LAB — CBC WITH DIFFERENTIAL/PLATELET
Abs Immature Granulocytes: 0.17 10*3/uL — ABNORMAL HIGH (ref 0.00–0.07)
Basophils Absolute: 0.1 10*3/uL (ref 0.0–0.1)
Basophils Relative: 0 %
Eosinophils Absolute: 0.2 10*3/uL (ref 0.0–0.5)
Eosinophils Relative: 1 %
HCT: 26.7 % — ABNORMAL LOW (ref 36.0–46.0)
Hemoglobin: 9 g/dL — ABNORMAL LOW (ref 12.0–15.0)
Immature Granulocytes: 1 %
Lymphocytes Relative: 7 %
Lymphs Abs: 1.3 10*3/uL (ref 0.7–4.0)
MCH: 28.1 pg (ref 26.0–34.0)
MCHC: 33.7 g/dL (ref 30.0–36.0)
MCV: 83.4 fL (ref 80.0–100.0)
Monocytes Absolute: 1.4 10*3/uL — ABNORMAL HIGH (ref 0.1–1.0)
Monocytes Relative: 7 %
Neutro Abs: 16.4 10*3/uL — ABNORMAL HIGH (ref 1.7–7.7)
Neutrophils Relative %: 84 %
Platelets: 560 10*3/uL — ABNORMAL HIGH (ref 150–400)
RBC: 3.2 MIL/uL — ABNORMAL LOW (ref 3.87–5.11)
RDW: 19 % — ABNORMAL HIGH (ref 11.5–15.5)
WBC: 19.5 10*3/uL — ABNORMAL HIGH (ref 4.0–10.5)
nRBC: 0 % (ref 0.0–0.2)

## 2019-10-16 LAB — BASIC METABOLIC PANEL
Anion gap: 22 — ABNORMAL HIGH (ref 5–15)
BUN: 139 mg/dL — ABNORMAL HIGH (ref 6–20)
CO2: 19 mmol/L — ABNORMAL LOW (ref 22–32)
Calcium: 9.2 mg/dL (ref 8.9–10.3)
Chloride: 92 mmol/L — ABNORMAL LOW (ref 98–111)
Creatinine, Ser: 18.93 mg/dL — ABNORMAL HIGH (ref 0.44–1.00)
GFR calc Af Amer: 2 mL/min — ABNORMAL LOW (ref >60)
GFR calc non Af Amer: 2 mL/min — ABNORMAL LOW (ref >60)
Glucose, Bld: 90 mg/dL (ref 70–99)
Potassium: 6.2 mmol/L — ABNORMAL HIGH (ref 3.5–5.1)
Sodium: 133 mmol/L — ABNORMAL LOW (ref 135–145)

## 2019-10-16 LAB — LACTIC ACID, PLASMA: Lactic Acid, Venous: 1 mmol/L (ref 0.5–1.9)

## 2019-10-16 LAB — RETICULOCYTES
Immature Retic Fract: 11.3 % (ref 2.3–15.9)
RBC.: 2.84 MIL/uL — ABNORMAL LOW (ref 3.87–5.11)
Retic Count, Absolute: 41.7 10*3/uL (ref 19.0–186.0)
Retic Ct Pct: 1.5 % (ref 0.4–3.1)

## 2019-10-16 LAB — LIPASE, BLOOD: Lipase: 30 U/L (ref 11–51)

## 2019-10-16 MED ORDER — ACETAMINOPHEN 650 MG RE SUPP: 650.0000 mg | Freq: Four times a day (QID) | RECTAL | Status: DC | PRN

## 2019-10-16 MED ORDER — DEXTROSE 50 % IV SOLN
1.0000 | Freq: Once | INTRAVENOUS | Status: AC
  Administered 2019-10-16: 50 mL via INTRAVENOUS
  Filled 2019-10-16: qty 50

## 2019-10-16 MED ORDER — ASCORBIC ACID 500 MG PO TABS
500.0000 mg | ORAL_TABLET | Freq: Every day | ORAL | Status: DC
  Filled 2019-10-16 (×2): qty 1

## 2019-10-16 MED ORDER — LORAZEPAM 1 MG PO TABS: 1.0000 mg | ORAL_TABLET | Freq: Once | ORAL | Status: DC

## 2019-10-16 MED ORDER — ONDANSETRON HCL 4 MG/2ML IJ SOLN: 4.0000 mg | Freq: Four times a day (QID) | INTRAMUSCULAR | Status: DC | PRN

## 2019-10-16 MED ORDER — ONDANSETRON HCL 4 MG/2ML IJ SOLN
4.0000 mg | Freq: Once | INTRAMUSCULAR | Status: AC
  Administered 2019-10-16: 4 mg via INTRAVENOUS
  Filled 2019-10-16: qty 2

## 2019-10-16 MED ORDER — VITAMIN D 25 MCG (1000 UNIT) PO TABS
1000.0000 [IU] | ORAL_TABLET | Freq: Every day | ORAL | Status: DC
  Administered 2019-10-16: 1000 [IU] via ORAL
  Filled 2019-10-16 (×2): qty 1

## 2019-10-16 MED ORDER — SODIUM POLYSTYRENE SULFONATE 15 GM/60ML PO SUSP
15.0000 g | Freq: Once | ORAL | Status: AC
  Administered 2019-10-16: 15 g via ORAL
  Filled 2019-10-16: qty 60

## 2019-10-16 MED ORDER — HYDROCODONE-ACETAMINOPHEN 5-325 MG PO TABS
1.0000 | ORAL_TABLET | ORAL | Status: DC | PRN
  Administered 2019-10-17 (×2): 1 via ORAL
  Filled 2019-10-16 (×2): qty 1

## 2019-10-16 MED ORDER — TRAZODONE HCL 50 MG PO TABS
25.0000 mg | ORAL_TABLET | Freq: Every evening | ORAL | Status: DC | PRN
  Administered 2019-10-17: 01:00:00 25 mg via ORAL
  Filled 2019-10-16: qty 1

## 2019-10-16 MED ORDER — HYDROMORPHONE HCL 1 MG/ML IJ SOLN
0.5000 mg | Freq: Once | INTRAMUSCULAR | Status: AC
  Administered 2019-10-16: 0.5 mg via INTRAVENOUS
  Filled 2019-10-16: qty 1

## 2019-10-16 MED ORDER — ONDANSETRON HCL 4 MG PO TABS: 4.0000 mg | ORAL_TABLET | Freq: Four times a day (QID) | ORAL | Status: DC | PRN

## 2019-10-16 MED ORDER — HEPARIN SODIUM (PORCINE) 5000 UNIT/ML IJ SOLN
5000.0000 [IU] | Freq: Three times a day (TID) | INTRAMUSCULAR | Status: DC
  Administered 2019-10-16 – 2019-10-17 (×2): 5000 [IU] via SUBCUTANEOUS
  Filled 2019-10-16 (×2): qty 1

## 2019-10-16 MED ORDER — SODIUM CHLORIDE 0.9 % IV SOLN: INTRAVENOUS | Status: DC

## 2019-10-16 MED ORDER — CALCIUM GLUCONATE-NACL 1-0.675 GM/50ML-% IV SOLN
1.0000 g | Freq: Once | INTRAVENOUS | Status: AC
  Administered 2019-10-16: 1000 mg via INTRAVENOUS
  Filled 2019-10-16: qty 50

## 2019-10-16 MED ORDER — INSULIN ASPART 100 UNIT/ML ~~LOC~~ SOLN
7.0000 [IU] | Freq: Once | SUBCUTANEOUS | Status: AC
  Administered 2019-10-16: 7 [IU] via INTRAVENOUS
  Filled 2019-10-16: qty 1

## 2019-10-16 MED ORDER — SODIUM BICARBONATE 8.4 % IV SOLN
50.0000 meq | Freq: Once | INTRAVENOUS | Status: AC
  Administered 2019-10-17: 01:00:00 50 meq via INTRAVENOUS
  Filled 2019-10-16: qty 50

## 2019-10-16 MED ORDER — ACETAMINOPHEN 325 MG PO TABS: 650.0000 mg | ORAL_TABLET | Freq: Four times a day (QID) | ORAL | Status: DC | PRN

## 2019-10-16 MED ORDER — LORAZEPAM 0.5 MG PO TABS
0.5000 mg | ORAL_TABLET | ORAL | Status: DC | PRN
  Administered 2019-10-17: 0.5 mg via ORAL
  Filled 2019-10-16: qty 1

## 2019-10-16 MED ORDER — MORPHINE SULFATE ER 15 MG PO TBCR
15.0000 mg | EXTENDED_RELEASE_TABLET | Freq: Three times a day (TID) | ORAL | Status: DC
  Administered 2019-10-16: 15 mg via ORAL
  Filled 2019-10-16 (×3): qty 1

## 2019-10-16 MED ORDER — MAGNESIUM HYDROXIDE 400 MG/5ML PO SUSP
30.0000 mL | Freq: Every day | ORAL | Status: DC | PRN
  Filled 2019-10-16: qty 30

## 2019-10-16 NOTE — ED Notes (Signed)
PT attempting to urinate

## 2019-10-16 NOTE — ED Notes (Signed)
Bladder scan shows 17cc. MD notified.

## 2019-10-16 NOTE — Progress Notes (Signed)
This RN attempted to complete patient admission navigator. Patient refusing to answer questions at this time stating "will you please leave me alone". Primary RN at bedside at this time. RN is aware of patient request and incomplete admission.
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
## 2019-10-16 NOTE — ED Notes (Signed)
PT sat up and given soup and crackers and water
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
## 2019-10-16 NOTE — ED Triage Notes (Signed)
PT to ED via EMS from home c/o back pain, vomiting, generalized swelling, and not peeing for 3 days. PT w/ hx of metastisized colon cancer and not having a ureter. PT has had a tube from her left kidney, pt has had pain at that site since. PT alert and oriented.

## 2019-10-17 ENCOUNTER — Inpatient Hospital Stay: Payer: Medicare Other

## 2019-10-17 DIAGNOSIS — C20 Malignant neoplasm of rectum: Secondary | ICD-10-CM

## 2019-10-17 DIAGNOSIS — N179 Acute kidney failure, unspecified: Secondary | ICD-10-CM

## 2019-10-17 DIAGNOSIS — E875 Hyperkalemia: Secondary | ICD-10-CM

## 2019-10-17 DIAGNOSIS — C799 Secondary malignant neoplasm of unspecified site: Secondary | ICD-10-CM

## 2019-10-17 DIAGNOSIS — Z7189 Other specified counseling: Secondary | ICD-10-CM

## 2019-10-17 DIAGNOSIS — Z515 Encounter for palliative care: Secondary | ICD-10-CM

## 2019-10-17 DIAGNOSIS — C7951 Secondary malignant neoplasm of bone: Secondary | ICD-10-CM

## 2019-10-17 LAB — BASIC METABOLIC PANEL
Anion gap: 17 — ABNORMAL HIGH (ref 5–15)
Anion gap: 20 — ABNORMAL HIGH (ref 5–15)
BUN: 145 mg/dL — ABNORMAL HIGH (ref 6–20)
BUN: 147 mg/dL — ABNORMAL HIGH (ref 6–20)
CO2: 23 mmol/L (ref 22–32)
CO2: 21 mmol/L — ABNORMAL LOW (ref 22–32)
Calcium: 9.1 mg/dL (ref 8.9–10.3)
Calcium: 8.7 mg/dL — ABNORMAL LOW (ref 8.9–10.3)
Chloride: 94 mmol/L — ABNORMAL LOW (ref 98–111)
Chloride: 96 mmol/L — ABNORMAL LOW (ref 98–111)
Creatinine, Ser: 18.77 mg/dL — ABNORMAL HIGH (ref 0.44–1.00)
Creatinine, Ser: 18.28 mg/dL — ABNORMAL HIGH (ref 0.44–1.00)
GFR calc Af Amer: 2 mL/min — ABNORMAL LOW (ref >60)
GFR calc Af Amer: 2 mL/min — ABNORMAL LOW (ref >60)
GFR calc non Af Amer: 2 mL/min — ABNORMAL LOW (ref >60)
GFR calc non Af Amer: 2 mL/min — ABNORMAL LOW (ref >60)
Glucose, Bld: 65 mg/dL — ABNORMAL LOW (ref 70–99)
Glucose, Bld: 66 mg/dL — ABNORMAL LOW (ref 70–99)
Potassium: 6.1 mmol/L — ABNORMAL HIGH (ref 3.5–5.1)
Potassium: 5.7 mmol/L — ABNORMAL HIGH (ref 3.5–5.1)
Sodium: 134 mmol/L — ABNORMAL LOW (ref 135–145)
Sodium: 137 mmol/L (ref 135–145)

## 2019-10-17 LAB — CBC
HCT: 24.1 % — ABNORMAL LOW (ref 36.0–46.0)
Hemoglobin: 7.9 g/dL — ABNORMAL LOW (ref 12.0–15.0)
MCH: 27.6 pg (ref 26.0–34.0)
MCHC: 32.8 g/dL (ref 30.0–36.0)
MCV: 84.3 fL (ref 80.0–100.0)
Platelets: 555 10*3/uL — ABNORMAL HIGH (ref 150–400)
RBC: 2.86 MIL/uL — ABNORMAL LOW (ref 3.87–5.11)
RDW: 18.6 % — ABNORMAL HIGH (ref 11.5–15.5)
WBC: 14.1 10*3/uL — ABNORMAL HIGH (ref 4.0–10.5)
nRBC: 0 % (ref 0.0–0.2)

## 2019-10-17 LAB — FOLATE: Folate: 11.4 ng/mL (ref >5.9)

## 2019-10-17 LAB — LACTATE DEHYDROGENASE: LDH: 205 U/L — ABNORMAL HIGH (ref 98–192)

## 2019-10-17 LAB — IRON AND TIBC
Iron: 56 ug/dL (ref 28–170)
Saturation Ratios: 44 % — ABNORMAL HIGH (ref 10.4–31.8)
TIBC: 127 ug/dL — ABNORMAL LOW (ref 250–450)
UIBC: 71 ug/dL

## 2019-10-17 LAB — VITAMIN B12: Vitamin B-12: 2687 pg/mL — ABNORMAL HIGH (ref 180–914)

## 2019-10-17 LAB — GLUCOSE, CAPILLARY: Glucose-Capillary: 86 mg/dL (ref 70–99)

## 2019-10-17 LAB — FERRITIN: Ferritin: 192 ng/mL (ref 11–307)

## 2019-10-17 LAB — LACTIC ACID, PLASMA: Lactic Acid, Venous: 1 mmol/L (ref 0.5–1.9)

## 2019-10-17 LAB — SARS CORONAVIRUS 2 (TAT 6-24 HRS): SARS Coronavirus 2: NEGATIVE

## 2019-10-17 MED ORDER — LORAZEPAM 2 MG/ML IJ SOLN
0.5000 mg | INTRAMUSCULAR | Status: DC | PRN
  Administered 2019-10-17 – 2019-10-18 (×2): 0.5 mg via INTRAVENOUS
  Filled 2019-10-17 (×3): qty 1

## 2019-10-17 MED ORDER — HYDROMORPHONE HCL 1 MG/ML IJ SOLN
0.5000 mg | INTRAMUSCULAR | Status: DC | PRN
  Administered 2019-10-17: 08:00:00 0.5 mg via INTRAVENOUS
  Filled 2019-10-17: qty 1

## 2019-10-17 MED ORDER — DEXTROSE 50 % IV SOLN
12.5000 g | INTRAVENOUS | Status: AC
  Administered 2019-10-17: 12.5 g via INTRAVENOUS
  Filled 2019-10-17: qty 50

## 2019-10-17 MED ORDER — HYDROMORPHONE HCL 1 MG/ML IJ SOLN
INTRAMUSCULAR | Status: AC
  Filled 2019-10-17: qty 1

## 2019-10-17 MED ORDER — HYDROMORPHONE HCL 1 MG/ML IJ SOLN
1.0000 mg | INTRAMUSCULAR | Status: DC | PRN
  Administered 2019-10-17 – 2019-10-18 (×4): 1 mg via INTRAVENOUS
  Filled 2019-10-17 (×4): qty 1

## 2019-10-17 MED ORDER — HYDRALAZINE HCL 20 MG/ML IJ SOLN
10.0000 mg | Freq: Four times a day (QID) | INTRAMUSCULAR | Status: DC | PRN
  Administered 2019-10-17: 18:00:00 10 mg via INTRAVENOUS
  Filled 2019-10-17: qty 1

## 2019-10-17 MED ORDER — DEXTROSE-NACL 5-0.9 % IV SOLN: INTRAVENOUS | Status: DC

## 2019-10-17 NOTE — Progress Notes (Signed)
[facility] Progress Note  Patient: Heather Turner    VZC:[id number]  DOA: 10/21/2019     Date of Service: the patient was seen and examined on 10/17/2019  Chief Complaint  Patient presents with  . Back Pain  . Emesis   Brief hospital course:  Heather Turner  is a 54 y.o. frail chronically ill lethargic Caucasian female with a known history of colon cancer on home hospice, who presented to the emergency room with acute onset of recurrent vomiting and back pain as well as inability to urinate and generalized malaise and lethargy.  She admitted to history of diarrhea.  She denied cough or wheezing or chest pain or palpitations.  She denied any dyspnea or hemoptysis.  She admits to left-sided back pain at the site of previous nephrostomy.  No anterior abdominal pain.  She has been having lower extremity edema over the last several weeks.  No fever or chills.  No chest pain or palpitations.  Upon presentation to the emergency room, blood pressure was 151/81 with otherwise normal vital signs.  Labs revealed remarkable elevation in BUN of 139 and creatinine of 18.9 compared to 13 and 0.79 on 08/22/2019 And sodium was 133 with chloride of 92, potassium of 6.2 and anion gap of 22 with serum lipase of 30 and magnesium of 2.2.  Her alk phos was 196 and lactic acid was 1.  CBC showed except as of 19.5 with neutrophilia and hemoglobin is 9] 26.7 with platelets of 560.  Previous H&H were 12.3 and 39.3 on 08/22/2019.  Urinalysis was unremarkable.  She having abdominal and pelvic scan that showed below mentioned abnormalities: 1. Partial small bowel obstruction. Transition point is in the posterior pelvis and may be due to adhesive disease, local recurrence of rectal carcinoma, or peritoneal carcinomatosis. Additionally, there is new right-sided hydronephrosis. There is a progressive soft tissue lesion within the right posterior pelvis which may involve the distal right ureter. 2. Increase in size of  tumor within the left renal pelvis with persistent hydronephrosis. 3. Progression of liver metastasis. 4. Progression of peritoneal nodularity. 5. Increase in size of nodal mass within the left common iliac region extends into the left psoas muscle. 6. Mild increase in size of splenic lesions. 7. Increase in size of expansile left lateral eleventh bone metastasis. 8. Stable left lower lobe pulmonary nodule. 9. Aortic Atherosclerosis (ICD10-I70.0).  The patient was given an amp of calcium gluconate, D50 insulin, 15 g of p.o. Kayexalate for hyper kalemia labs 1 mg of IV Ativan and 4 mg IV Zofran.  She will be admitted to a medically  monitored bed for further evaluation and   Currently further plan is continue medical management, palliative care on the board for pain control and discussed goals of care, patient refuses nephrostomy tube and hemodialysis.  Further discussion needs to be done with family Nephrology will follow and urology will follow.  Assessment and Plan:  # Acute kidney injury in the setting of right hydronephrosis without urolithiasis, with metabolic acidosis. -She will be hydrated with IV normal saline at this is likely in mostly prerenal, especially given hyponatremia and hypochloremia. -We will follow BMPs. -She will have a Foley catheter placed. - renal ultrasound: 1. Moderate hydronephrosis on the right, similar to CT from 1 day prior. Site of obstruction not seen. 2. Fluid containing sinus tract from lateral left kidney to skin surface, apparently at the site of previous nephrostomy catheter. 3. Somewhat complex fluid collection in the upper pole left kidney  which potentially could represent a dilated upper pole calyx with stenosis causing localized dilatation in this area. -We will avoid nephrotoxins. -The patient is refusing hemodialysis if she needs it. -A urology consultation will be obtained.  I notified Dr. Bernardo Heater. -Nephrology consultation will be obtained.   I notified Dr. Juleen China.  2.  Hyperkalemia. -This was managed in the ER and her potassium will be monitored. -s/p an amp of sodium bicarbonate.  3.  Metastatic colon cancer with liver mets. -Pain management will be provided. -Would be appropriate for comfort measures if she is not responding to hydration. --Palliative care consulted for goals of care discussion with family, patient is under hospice care at home and she  is appropriate for comfort measures only if family agrees.  4.  Anemia. -We will follow hemoglobin and hematocrits. -Anemia work-up will be sent.  5.  DVT prophylaxis. -Subcutaneous heparin.  Body mass index is 22.81 kg/m.  Interventions:  Diet: Currently n.p.o. secondary to partial bowel obstruction, nausea and vomiting.  Advance as per tolerance. DVT Prophylaxis: Subcutaneous Heparin    Advance goals of care discussion: DNR  Family Communication: family was not present at bedside, at the time of interview.  Opportunity was given to ask question and all questions were answered satisfactorily.  Palliative care will discuss with family for further discussion of goals of care  Disposition:  Pt is from Home, admitted with nausea vomiting and dysuria, still has same complaints, which precludes a safe discharge. Discharge to home with hospice services and possible comfort measures, when palliative care were discussed with the family for goals of care.  Subjective: Patient was seen and examined at bedside, patient complaining of that she cannot pee, and still has nausea and vomiting.  No abdominal pain. Complaining of bilateral lower extremity pain and edema.  Physical Exam: General:  alert oriented to time, place, and person.  Appear in moderate distress, affect anxious Eyes: PERRLA ENT: Oral Mucosa Clear, dry  Neck: no JVD,  Cardiovascular: S1 and S2 Present, no Murmur,  Respiratory: good respiratory effort, Bilateral Air entry equal and Decreased, no  Crackles, no wheezes Abdomen: Bowel Sound present, Soft and mild tenderness,  Skin: no rashes  Extremities: 4+ Pedal edema, and tenderness Neurologic: without any new focal findings Gait not checked due to patient safety concerns  Vitals:   10/17/19 0200 10/17/19 0813 10/17/19 0900 10/17/19 1112  BP:  (!) 160/102 (!) 166/82 (!) 195/100  Pulse: 85 (!) 108  (!) 109  Resp: 12 (!) 22  15  Temp:      TempSrc:      SpO2: 99% 99%    Weight:      Height:        Intake/Output Summary (Last 24 hours) at 10/17/2019 1422 Last data filed at 10/17/2019 0900 Gross per 24 hour  Intake 373.62 ml  Output 10 ml  Net 363.62 ml   Filed Weights   10/29/2019 1634  Weight: 68 kg    Data Reviewed: I have personally reviewed and interpreted daily labs, tele strips, imagings as discussed above. I reviewed all nursing notes, pharmacy notes, vitals, pertinent old records I have discussed plan of care as described above with RN and patient/family.  CBC: Recent Labs  Lab 10/23/2019 1702 10/17/19 0437  WBC 19.5* 14.1*  NEUTROABS 16.4*  --   HGB 9.0* 7.9*  HCT 26.7* 24.1*  MCV 83.4 84.3  PLT 560* 401*   Basic Metabolic Panel: Recent Labs  Lab 10/13/2019 1702 10/15/2019 2326  10/17/19 0437  NA 133* 134* 137  K 6.2* 6.1* 5.7*  CL 92* 94* 96*  CO2 19* 23 21*  GLUCOSE 90 65* 66*  BUN 139* 145* 147*  CREATININE 18.93* 18.77* 18.28*  CALCIUM 9.2 9.1 8.7*    Studies: CT ABDOMEN PELVIS WO CONTRAST  Result Date: 10/06/2019 CLINICAL DATA:  Acute renal failure. History colon cancer. EXAM: CT ABDOMEN AND PELVIS WITHOUT CONTRAST TECHNIQUE: Multidetector CT imaging of the abdomen and pelvis was performed following the standard protocol without IV contrast. COMPARISON:  CT abdomen pelvis 08/22/2019 FINDINGS: The examination is degraded by motion and the lack of intravenous contrast. This particularly limits comparison of the stage of the patient's malignancy to the prior study. LOWER CHEST: Numerous nodules  in the lung bases, the largest of which is in the left lower lobe and measures 1.2 cm. HEPATOBILIARY: There are numerous hypodense lesions scattered within the liver. Without IV contrast measurement is difficult. Dilated gallbladder without inflammatory change. PANCREAS: Normal pancreas. No ductal dilatation or peripancreatic fluid collection. SPLEEN: Splenic lesions are not visible without IV contrast. ADRENALS/URINARY TRACT: The adrenal glands are normal. There is moderate right hydronephrosis. The ureter is dilated. A definite source of obstruction is not visualized. There is hyperdense material at the lower portion of the left renal collecting system, similar to the prior study. STOMACH/BOWEL: There is no hiatal hernia. Normal duodenal course and caliber. No small bowel dilatation or inflammation. No focal colonic abnormality. Appendix not definitively visualized. VASCULAR/LYMPHATIC: There is calcific atherosclerosis of the abdominal aorta. No abdominal or pelvic lymphadenopathy. REPRODUCTIVE: Uterus appears normal. No adnexal mass. MUSCULOSKELETAL. No bony spinal canal stenosis. Left lower rib lesion is unchanged. OTHER: Diffuse anasarca IMPRESSION: 1. Limited study due to motion and lack of intravenous contrast. 2. Moderate right hydronephrosis and hydroureter without definite source of obstruction. Pelvic mass demonstrated on the prior study is a possible source, though poorly characterized on this exam. 3. Numerous hypodense lesions scattered within the liver, consistent with metastatic disease. 4. Numerous pulmonary nodules, consistent with metastatic disease, worsened. 5. Hyperdense material at the lower portion of the left renal collecting system, similar to the prior study. No definite increase in size of left renal mass. 6. Aortic Atherosclerosis (ICD10-I70.0). Electronically Signed   By: Ulyses Jarred M.D.   On: 10/13/2019 19:21   US RENAL  Result Date: 10/17/2019 CLINICAL DATA:  Acute kidney  injury EXAM: RENAL / URINARY TRACT ULTRASOUND COMPLETE COMPARISON:  CT abdomen and pelvis October 16, 2019 FINDINGS: Right Kidney: Renal measurements: 13.2 x 6.6 x 7.6 cm = volume: 331 mL . Echogenicity and renal cortical thickness are within normal limits. No mass or perinephric fluid visualized. There is moderate hydronephrosis on the right. Left Kidney: Renal measurements: 10.5 x 5.3 x 5.6 cm = volume: 161 mL. Echogenicity and renal cortical thickness are within normal limits. There is a complex fluid collection in the upper pole left kidney measuring 4.7 x 3.2 x 2.7 cm. A sinus tract extends from the left kidney laterally to the skin surface, potentially at the site of previous nephrostomy catheter. There is no appreciable hydronephrosis on the left. Bladder: Appears normal for degree of bladder distention. Other: None. IMPRESSION: 1. Moderate hydronephrosis on the right, similar to CT from 1 day prior. Site of obstruction not seen. 2. Fluid containing sinus tract from lateral left kidney to skin surface, apparently at the site of previous nephrostomy catheter. 3. Somewhat complex fluid collection in the upper pole left kidney which potentially could represent  a dilated upper pole calyx with stenosis causing localized dilatation in this area. Electronically Signed   By: Lowella Grip III M.D.   On: 10/17/2019 11:02    Scheduled Meds: . ascorbic acid  500 mg Oral Daily  . cholecalciferol  1,000 Units Oral Daily  . heparin  5,000 Units Subcutaneous Q8H   Continuous Infusions: . dextrose 5 % and 0.9% NaCl 100 mL/hr at 10/17/19 0828   PRN Meds: acetaminophen **OR** acetaminophen, hydrALAZINE, HYDROcodone-acetaminophen, HYDROmorphone (DILAUDID) injection, LORazepam, magnesium hydroxide, ondansetron **OR** ondansetron (ZOFRAN) IV, traZODone  Time spent: 35 minutes  Author: Val Riles. MD [facility] 10/17/2019 2:22 PM  To reach On-call, see care teams to locate the attending and reach out  to them via [email or web address]. If 7PM-7AM, please contact night-coverage If you still have difficulty reaching the attending provider, please page the [facility] (Director on Call) for [facility] on amion for assistance.
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
## 2019-10-17 NOTE — Progress Notes (Signed)
This RN attempted to assess pt upon admission to the floor. Pt refused assessment stating "will you please leave me alone". SWOT RN at bedside and heard pt say this. Will attempt to assess again at a later time
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
## 2019-10-18 DIAGNOSIS — Z515 Encounter for palliative care: Secondary | ICD-10-CM

## 2019-10-18 LAB — CBC
HCT: 23.2 % — ABNORMAL LOW (ref 36.0–46.0)
Hemoglobin: 8 g/dL — ABNORMAL LOW (ref 12.0–15.0)
MCH: 28.2 pg (ref 26.0–34.0)
MCHC: 34.5 g/dL (ref 30.0–36.0)
MCV: 81.7 fL (ref 80.0–100.0)
Platelets: 559 10*3/uL — ABNORMAL HIGH (ref 150–400)
RBC: 2.84 MIL/uL — ABNORMAL LOW (ref 3.87–5.11)
RDW: 19 % — ABNORMAL HIGH (ref 11.5–15.5)
WBC: 20.8 10*3/uL — ABNORMAL HIGH (ref 4.0–10.5)
nRBC: 0 % (ref 0.0–0.2)

## 2019-10-18 LAB — BASIC METABOLIC PANEL
Anion gap: 19 — ABNORMAL HIGH (ref 5–15)
BUN: 141 mg/dL — ABNORMAL HIGH (ref 6–20)
CO2: 19 mmol/L — ABNORMAL LOW (ref 22–32)
Calcium: 8 mg/dL — ABNORMAL LOW (ref 8.9–10.3)
Chloride: 98 mmol/L (ref 98–111)
Creatinine, Ser: 18.28 mg/dL — ABNORMAL HIGH (ref 0.44–1.00)
GFR calc Af Amer: 2 mL/min — ABNORMAL LOW (ref >60)
GFR calc non Af Amer: 2 mL/min — ABNORMAL LOW (ref >60)
Glucose, Bld: 77 mg/dL (ref 70–99)
Potassium: 5.7 mmol/L — ABNORMAL HIGH (ref 3.5–5.1)
Sodium: 136 mmol/L (ref 135–145)

## 2019-10-18 LAB — MAGNESIUM: Magnesium: 2.8 mg/dL — ABNORMAL HIGH (ref 1.7–2.4)

## 2019-10-18 LAB — PHOSPHORUS: Phosphorus: 6.7 mg/dL — ABNORMAL HIGH (ref 2.5–4.6)

## 2019-10-18 MED ORDER — BIOTENE DRY MOUTH MT LIQD
15.0000 mL | Freq: Two times a day (BID) | OROMUCOSAL | Status: DC
  Administered 2019-10-18 – 2019-10-20 (×5): 15 mL via TOPICAL

## 2019-10-18 MED ORDER — GLYCOPYRROLATE 0.2 MG/ML IJ SOLN
0.2000 mg | INTRAMUSCULAR | Status: AC
  Administered 2019-10-18: 0.2 mg via INTRAVENOUS
  Filled 2019-10-18: qty 1

## 2019-10-18 MED ORDER — SODIUM CHLORIDE 0.9 % IV SOLN
1.0000 mg/h | INTRAVENOUS | Status: DC
  Administered 2019-10-18: 10:00:00 1 mg/h via INTRAVENOUS
  Filled 2019-10-18: qty 5

## 2019-10-18 MED ORDER — GLYCOPYRROLATE 0.2 MG/ML IJ SOLN: 0.2000 mg | INTRAMUSCULAR | Status: DC | PRN

## 2019-10-18 MED ORDER — LORAZEPAM 2 MG/ML IJ SOLN
1.0000 mg | INTRAMUSCULAR | Status: DC | PRN
  Administered 2019-10-18: 10:00:00 1 mg via INTRAVENOUS

## 2019-10-18 MED ORDER — HYDROMORPHONE BOLUS VIA INFUSION
1.0000 mg | INTRAVENOUS | Status: DC | PRN
  Filled 2019-10-18: qty 1
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
## 2019-10-18 NOTE — Progress Notes (Signed)
[facility] Progress Note  Patient: Heather Turner    SMO:[id number]  DOA: 10/11/2019     Date of Service: the patient was seen and examined on 10/18/2019  Chief Complaint  Patient presents with  . Back Pain  . Emesis   Brief hospital course:  Heather Turner  is a 54 y.o. frail chronically ill lethargic Caucasian female with a known history of colon cancer on home hospice, who presented to the emergency room with acute onset of recurrent vomiting and back pain as well as inability to urinate and generalized malaise and lethargy.  She admitted to history of diarrhea.  She denied cough or wheezing or chest pain or palpitations.  She denied any dyspnea or hemoptysis.  She admits to left-sided back pain at the site of previous nephrostomy.  No anterior abdominal pain.  She has been having lower extremity edema over the last several weeks.  No fever or chills.  No chest pain or palpitations.  Upon presentation to the emergency room, blood pressure was 151/81 with otherwise normal vital signs.  Labs revealed remarkable elevation in BUN of 139 and creatinine of 18.9 compared to 13 and 0.79 on 08/22/2019 And sodium was 133 with chloride of 92, potassium of 6.2 and anion gap of 22 with serum lipase of 30 and magnesium of 2.2.  Her alk phos was 196 and lactic acid was 1.  CBC showed except as of 19.5 with neutrophilia and hemoglobin is 9] 26.7 with platelets of 560.  Previous H&H were 12.3 and 39.3 on 08/22/2019.  Urinalysis was unremarkable.  She having abdominal and pelvic scan that showed below mentioned abnormalities: 1. Partial small bowel obstruction. Transition point is in the posterior pelvis and may be due to adhesive disease, local recurrence of rectal carcinoma, or peritoneal carcinomatosis. Additionally, there is new right-sided hydronephrosis. There is a progressive soft tissue lesion within the right posterior pelvis which may involve the distal right ureter. 2. Increase in size of  tumor within the left renal pelvis with persistent hydronephrosis. 3. Progression of liver metastasis. 4. Progression of peritoneal nodularity. 5. Increase in size of nodal mass within the left common iliac region extends into the left psoas muscle. 6. Mild increase in size of splenic lesions. 7. Increase in size of expansile left lateral eleventh bone metastasis. 8. Stable left lower lobe pulmonary nodule. 9. Aortic Atherosclerosis (ICD10-I70.0).  The patient was given an amp of calcium gluconate, D50 insulin, 15 g of p.o. Kayexalate for hyper kalemia labs 1 mg of IV Ativan and 4 mg IV Zofran.  She will be admitted to a medically  monitored bed for further evaluation and   Currently further plan is continue medical management, palliative care on the board for pain control and discussed goals of care, patient refuses nephrostomy tube and hemodialysis.  Further discussion needs to be done with family Nephrology will follow and urology will follow.  Assessment and Plan:  # Metastatic colon cancer with liver mets. Palliative care consult appreciated, patient has been made comfort measures only As per oncologist patient was refusing IV chemotherapy in the past and pulled out nephrostomy tube in the past, seems to be uncooperative and not interested in the treatment.  So there is no point to start any treatment at this point due to poor prognosis, recommended palliative care. Patient's aunt was informed by palliative care that there is a poor prognosis and we have started comfort measures only.  # Acute kidney injury in the setting of right hydronephrosis  without urolithiasis, with metabolic acidosis. hyponatremia and hypochloremia. S/p IV hydration given, nephrologist was consulted, recommended no intervention secondary to poor prognosis and urologist was not consulted as patient did not wanted nephrostomy tube in the past so no intervention at this time. We will continue supportive care and  pain control  # Hyperkalemia secondary to renal failure.   Hyperkalemia treatment was given Continue supportive care now  # Anemia of chronic disease, continue supportive care.    Body mass index is 22.81 kg/m.  Interventions:  Diet: Currently n.p.o. secondary to partial bowel obstruction, nausea and vomiting.  Advance as per tolerance. DVT Prophylaxis: Subcutaneous Heparin    Advance goals of care discussion: DNR  Family Communication: family was not present at bedside, at the time of interview.  3/17 discussed with the patient's aunt over the phone about goals of care and she wanted oncology input which we called her and today patient's condition is deteriorating the patient was made comfort care only. Palliative care informed to patient's aunt regarding poor prognosis and starting comfort measures only.    Disposition:  Pt is from Home, admitted with nausea vomiting and dysuria.  She was intractable pain, palliative care help appreciated and patient has been made comfort measures only.   Subjective: Patient was seen and examined at bedside, patient's condition has been deteriorated to the point that patient has became nonverbal and she seems to be tired and very lethargic.  Patient is not in a condition to offer any complaints.  Patient has been started on comfort measures by palliative care.  Physical Exam: General: Seems very tired, unable to answer any questions Appear in moderate distress, affect anxious Eyes: PERRLA ENT: Oral Mucosa Clear, dry  Neck: no JVD,  Cardiovascular: S1 and S2 Present, no Murmur,  Respiratory: Decreased respiratory effort, Bilateral Air entry equal and Decreased, no Crackles, no wheezes Abdomen: Bowel Sound present, Soft and mild tenderness,  Skin: no rashes  Extremities: 4+ Pedal edema, and tenderness Neurologic: without any new focal findings Gait not checked due to patient safety concerns  Vitals:   10/17/19 1112 10/17/19 1600 10/17/19  1736 10/18/19 0000  BP: (!) 195/100  (!) 187/101 (!) 174/105  Pulse: (!) 109   (!) 103  Resp: 15   14  Temp:  97.9 F (36.6 C)  98.1 F (36.7 C)  TempSrc:  Oral  Oral  SpO2:      Weight:      Height:        Intake/Output Summary (Last 24 hours) at 10/18/2019 1404 Last data filed at 10/17/2019 1600 Gross per 24 hour  Intake 751.17 ml  Output --  Net 751.17 ml   Filed Weights   10/22/2019 1634  Weight: 68 kg    Data Reviewed: I have personally reviewed and interpreted daily labs, tele strips, imagings as discussed above. I reviewed all nursing notes, pharmacy notes, vitals, pertinent old records I have discussed plan of care as described above with RN and patient/family.  CBC: Recent Labs  Lab 10/08/2019 1702 10/17/19 0437 10/18/19 0429  WBC 19.5* 14.1* 20.8*  NEUTROABS 16.4*  --   --   HGB 9.0* 7.9* 8.0*  HCT 26.7* 24.1* 23.2*  MCV 83.4 84.3 81.7  PLT 560* 555* 834*   Basic Metabolic Panel: Recent Labs  Lab 10/26/2019 1702 10/05/2019 2326 10/17/19 0437 10/18/19 0429  NA 133* 134* 137 136  K 6.2* 6.1* 5.7* 5.7*  CL 92* 94* 96* 98  CO2 19* 23 21* 19*  GLUCOSE 90 65* 66* 77  BUN 139* 145* 147* 141*  CREATININE 18.93* 18.77* 18.28* 18.28*  CALCIUM 9.2 9.1 8.7* 8.0*  MG  --   --   --  2.8*  PHOS  --   --   --  6.7*    Studies: No results found.  Scheduled Meds: . antiseptic oral rinse  15 mL Topical BID   Continuous Infusions: . HYDROmorphone 1 mg/hr (10/18/19 1022)   PRN Meds: glycopyrrolate **OR** glycopyrrolate, HYDROmorphone, HYDROmorphone (DILAUDID) injection, LORazepam, [DISCONTINUED] ondansetron **OR** ondansetron (ZOFRAN) IV  Time spent: 35 minutes  Author: Val Riles. MD [facility] 10/18/2019 2:04 PM  To reach On-call, see care teams to locate the attending and reach out to them via [email or web address]. If 7PM-7AM, please contact night-coverage If you still have difficulty reaching the attending provider, please page the [facility] (Director on  Call) for [facility] on amion for assistance.

## 2019-10-19 MED ORDER — PROMETHAZINE HCL 25 MG/ML IJ SOLN
25.0000 mg | Freq: Four times a day (QID) | INTRAMUSCULAR | Status: DC | PRN
  Administered 2019-10-19: 25 mg via INTRAVENOUS
  Filled 2019-10-19: qty 1

## 2019-10-19 MED ORDER — GLYCOPYRROLATE 0.2 MG/ML IJ SOLN
0.2000 mg | INTRAMUSCULAR | Status: DC
  Administered 2019-10-19 – 2019-10-20 (×8): 0.2 mg via INTRAVENOUS
  Filled 2019-10-19 (×9): qty 1

## 2019-10-19 MED ORDER — LORAZEPAM 2 MG/ML IJ SOLN
1.0000 mg | INTRAMUSCULAR | Status: DC
  Administered 2019-10-19 – 2019-10-20 (×8): 1 mg via INTRAVENOUS
  Filled 2019-10-19 (×9): qty 1

## 2019-10-19 MED ORDER — FENTANYL BOLUS VIA INFUSION
50.0000 ug | INTRAVENOUS | Status: DC | PRN
  Administered 2019-10-19 – 2019-10-20 (×4): 50 ug via INTRAVENOUS
  Filled 2019-10-19: qty 50

## 2019-10-19 MED ORDER — FENTANYL 2500MCG IN NS 250ML (10MCG/ML) PREMIX INFUSION
50.0000 ug/h | INTRAVENOUS | Status: DC
  Administered 2019-10-19: 11:00:00 50 ug/h via INTRAVENOUS
  Administered 2019-10-20: 10:00:00 100 ug/h via INTRAVENOUS
  Filled 2019-10-19 (×2): qty 250

## 2019-10-19 NOTE — Progress Notes (Signed)
[facility] Progress Note  Patient: Heather Turner    GTX:[id number]  DOA: 10/22/2019     Date of Service: the patient was seen and examined on 10/19/2019  Chief Complaint  Patient presents with  . Back Pain  . Emesis   Brief hospital course:  Heather Turner  is a 54 y.o. frail chronically ill lethargic Caucasian female with a known history of colon cancer on home hospice, who presented to the emergency room with acute onset of recurrent vomiting and back pain as well as inability to urinate and generalized malaise and lethargy.  She admitted to history of diarrhea.  She denied cough or wheezing or chest pain or palpitations.  She denied any dyspnea or hemoptysis.  She admits to left-sided back pain at the site of previous nephrostomy.  No anterior abdominal pain.  She has been having lower extremity edema over the last several weeks.  No fever or chills.  No chest pain or palpitations.  Upon presentation to the emergency room, blood pressure was 151/81 with otherwise normal vital signs.  Labs revealed remarkable elevation in BUN of 139 and creatinine of 18.9 compared to 13 and 0.79 on 08/22/2019 And sodium was 133 with chloride of 92, potassium of 6.2 and anion gap of 22 with serum lipase of 30 and magnesium of 2.2.  Her alk phos was 196 and lactic acid was 1.  CBC showed except as of 19.5 with neutrophilia and hemoglobin is 9] 26.7 with platelets of 560.  Previous H&H were 12.3 and 39.3 on 08/22/2019.  Urinalysis was unremarkable.  She having abdominal and pelvic scan that showed below mentioned abnormalities: 1. Partial small bowel obstruction. Transition point is in the posterior pelvis and may be due to adhesive disease, local recurrence of rectal carcinoma, or peritoneal carcinomatosis. Additionally, there is new right-sided hydronephrosis. There is a progressive soft tissue lesion within the right posterior pelvis which may involve the distal right ureter. 2. Increase in size of  tumor within the left renal pelvis with persistent hydronephrosis. 3. Progression of liver metastasis. 4. Progression of peritoneal nodularity. 5. Increase in size of nodal mass within the left common iliac region extends into the left psoas muscle. 6. Mild increase in size of splenic lesions. 7. Increase in size of expansile left lateral eleventh bone metastasis. 8. Stable left lower lobe pulmonary nodule. 9. Aortic Atherosclerosis (ICD10-I70.0).  The patient was given an amp of calcium gluconate, D50 insulin, 15 g of p.o. Kayexalate for hyper kalemia labs 1 mg of IV Ativan and 4 mg IV Zofran.  She will be admitted to a medically  monitored bed for further evaluation and   Currently further plan is continue medical management, palliative care on the board for pain control and discussed goals of care, patient refuses nephrostomy tube and hemodialysis.  Further discussion needs to be done with family Nephrology will follow and urology will follow.  Assessment and Plan:  # Metastatic colon cancer with liver mets. Palliative care consult appreciated, patient has been made comfort measures only As per oncologist patient was refusing IV chemotherapy in the past and pulled out nephrostomy tube in the past, seems to be uncooperative and not interested in the treatment.  So there is no point to start any treatment at this point due to poor prognosis, recommended palliative care. Patient's aunt was informed by palliative care that there is a poor prognosis and we have started comfort measures only.  # Acute kidney injury in the setting of right hydronephrosis  without urolithiasis, with metabolic acidosis. hyponatremia and hypochloremia. S/p IV hydration given, nephrologist was consulted, recommended no intervention secondary to poor prognosis and urologist was not consulted as patient did not wanted nephrostomy tube in the past so no intervention at this time. We will continue supportive care and  pain control  # Hyperkalemia secondary to renal failure.   Hyperkalemia treatment was given Continue supportive care now  # Anemia of chronic disease, continue supportive care.    Body mass index is 22.81 kg/m.  Interventions:  Diet: Currently n.p.o. secondary to partial bowel obstruction, nausea and vomiting.  Advance as per tolerance. DVT Prophylaxis: Subcutaneous Heparin    Advance goals of care discussion: DNR  Family Communication: family was not present at bedside, at the time of interview.  3/17 discussed with the patient's aunt over the phone about goals of care and she wanted oncology input which we called her and today patient's condition is deteriorating the patient was made comfort care only. Palliative care informed to patient's aunt regarding poor prognosis and starting comfort measures only.    Disposition:  Pt is from Home, admitted with nausea vomiting and dysuria.  She has intractable pain, palliative care help appreciated and patient has been made comfort measures only.  Can be discharged to hospice when accepted.   Subjective: Patient was seen and examined at bedside, patient has become nonverbal after starting comfort measure pain medications.  Patient was looking around unable to offer any complaints, seems in mild discomfort due to pain   Physical Exam: General: Seems very tired, unable to answer any questions Appear in moderate distress, affect anxious Eyes: PERRLA ENT: Oral Mucosa Clear, dry  Neck: no JVD,  Cardiovascular: S1 and S2 Present, no Murmur,  Respiratory: Decreased respiratory effort, Bilateral Air entry equal and Decreased, no Crackles, no wheezes Abdomen: Bowel Sound present, Soft and mild tenderness,  Skin: no rashes  Extremities: 4+ Pedal edema, and tenderness Neurologic: without any new focal findings Gait not checked due to patient safety concerns  Vitals:   10/17/19 1736 10/18/19 0000 10/19/19 0058 10/19/19 0846  BP: (!)  187/101 (!) 174/105 (!) 161/89 (!) 154/81  Pulse:  (!) 103 95 94  Resp:  '14 20 15  '$ Temp:  98.1 F (36.7 C)    TempSrc:  Oral    SpO2:   91% 92%  Weight:      Height:        Intake/Output Summary (Last 24 hours) at 10/19/2019 1550 Last data filed at 10/18/2019 2200 Gross per 24 hour  Intake 0 ml  Output --  Net 0 ml   Filed Weights   10/15/2019 1634  Weight: 68 kg    Data Reviewed: I have personally reviewed and interpreted daily labs, tele strips, imagings as discussed above. I reviewed all nursing notes, pharmacy notes, vitals, pertinent old records I have discussed plan of care as described above with RN and patient/family.  CBC: Recent Labs  Lab 10/18/2019 1702 10/17/19 0437 10/18/19 0429  WBC 19.5* 14.1* 20.8*  NEUTROABS 16.4*  --   --   HGB 9.0* 7.9* 8.0*  HCT 26.7* 24.1* 23.2*  MCV 83.4 84.3 81.7  PLT 560* 555* 361*   Basic Metabolic Panel: Recent Labs  Lab 10/19/2019 1702 10/05/2019 2326 10/17/19 0437 10/18/19 0429  NA 133* 134* 137 136  K 6.2* 6.1* 5.7* 5.7*  CL 92* 94* 96* 98  CO2 19* 23 21* 19*  GLUCOSE 90 65* 66* 77  BUN 139* 145* 147*  141*  CREATININE 18.93* 18.77* 18.28* 18.28*  CALCIUM 9.2 9.1 8.7* 8.0*  MG  --   --   --  2.8*  PHOS  --   --   --  6.7*    Studies: No results found.  Scheduled Meds: . antiseptic oral rinse  15 mL Topical BID  . glycopyrrolate  0.2 mg Intravenous Q4H  . LORazepam  1 mg Intravenous Q4H   Continuous Infusions: . fentaNYL infusion INTRAVENOUS 75 mcg/hr (10/19/19 1340)   PRN Meds: fentaNYL, [DISCONTINUED] ondansetron **OR** ondansetron (ZOFRAN) IV, promethazine  Time spent: 35 minutes  Author: Val Riles. MD [facility] 10/19/2019 3:50 PM  To reach On-call, see care teams to locate the attending and reach out to them via [email or web address]. If 7PM-7AM, please contact night-coverage If you still have difficulty reaching the attending provider, please page the [facility] (Director on Call) for [facility]  on amion for assistance.

## 2019-10-19 NOTE — Plan of Care (Signed)

## 2019-10-19 NOTE — Care Management Important Message (Signed)
Important Message  Patient Details  Name: Heather Turner MRN: [id number] Date of Birth: 1966/02/10   Medicare Important Message Given:  N/A - LOS <3 / Initial given by admissions     Heather Turner 10/19/2019, 7:33 AM
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
## 2019-10-20 DIAGNOSIS — Z515 Encounter for palliative care: Secondary | ICD-10-CM

## 2019-10-24 ENCOUNTER — Ambulatory Visit: Payer: Medicare Other

## 2019-11-01 NOTE — Progress Notes (Signed)
On assessment patient noted to have to respiration or heart beat.  Notified Rufina Falco NP of patient death and no nurse may pronounce. Family notified of death by Rufina Falco NP

## 2019-11-01 NOTE — Progress Notes (Signed)
Patient has been resting more comfortably. She has required frequent suction and cleaning up of black emesis.
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
## 2019-11-01 NOTE — Progress Notes (Addendum)
PROGRESS NOTE    Heather Turner  [id number] DOB: 1965-09-08 DOA: 10/31/2019 PCP: Valerie Roys, DO    Brief Narrative: Heather Turner a89 y.o.frail chronically ill lethargic Caucasian femalewith a known history of colon cancer on home hospice, who presented to the emergency room with acute onset of recurrent vomiting and back pain as well as inability to urinate and generalized malaise and lethargy. She admitted to history of diarrhea. She denied cough or wheezing or chest pain or palpitations. She denied any dyspnea or hemoptysis. She admits to left-sided back pain at the site of previous nephrostomy. No anterior abdominal pain. She has been having lower extremity edema over the last several weeks. No fever or chills. No chest pain or palpitations.  3/20: Patient seen.  Exam deferred given comfort measures status.  On fentanyl infusion.  Visibly not in pain or distress    Assessment & Plan:   Active Problems:   Metastatic malignant neoplasm (HCC)   AKI (acute kidney injury) ([place])   Acute renal failure ([place])   Palliative care by specialist   Hyperkalemia   Comfort measures only status   Terminal care  #Metastatic colon cancer with liver mets. Palliative care consult appreciated, patient has been made comfort measures only As per oncologist patient was refusing IV chemotherapy in the past and pulled out nephrostomy tube in the past, seems to be uncooperative and not interested in the treatment.  So there is no point to start any treatment at this point due to poor prognosis, recommended palliative care. Patient's aunt was informed by palliative care that there is a poor prognosis and we have started comfort measures only. -Continue comfort measures.  All medications not focused on patient comfort have been discontinued  #Acute kidney injury in the setting of right hydronephrosis without urolithiasis, with metabolic acidosis. hyponatremia and  hypochloremia. S/p IV hydration given, nephrologist was consulted, recommended no intervention secondary to poor prognosis and urologist was not consulted as patient did not wanted nephrostomy tube in the past so no intervention at this time. We will continue supportive care and pain control  # Hyperkalemia secondary to renal failure.   Hyperkalemia treatment was given Continue supportive care now  # Anemia of chronic disease, continue supportive care.  DVT prophylaxis: None, comfort measures Code Status: DNR Family Communication: Ronald Pippins [phone] via phone on Nov 09, 2019 Disposition Plan: Comfort measures.  Anticipate in hospital death.  Consultants:   Palliative care  Procedures:   none  Antimicrobials:   none    Subjective: Seen and examined Exam deferred Patient not verbally responsive On comfort measures  Objective: Vitals:   10/18/19 0000 10/19/19 0058 10/19/19 0846 10/19/19 2355  BP: (!) 174/105 (!) 161/89 (!) 154/81 122/73  Pulse: (!) 103 95 94 (!) 128  Resp: 14 20 15 17   Temp: 98.1 F (36.7 C)   97.6 F (36.4 C)  TempSrc: Oral     SpO2:  91% 92% (!) 88%  Weight:      Height:        Intake/Output Summary (Last 24 hours) at November 09, 2019 1431 Last data filed at 2019/11/09 1427 Gross per 24 hour  Intake 320.75 ml  Output --  Net 320.75 ml   Filed Weights   10/11/2019 1634  Weight: 68 kg    Examination:  Exam deferred given comfort measures status    Data Reviewed: I have personally reviewed following labs and imaging studies  CBC: Recent Labs  Lab 10/30/2019 1702 10/17/19 0437 10/18/19 0429  WBC  19.5* 14.1* 20.8*  NEUTROABS 16.4*  --   --   HGB 9.0* 7.9* 8.0*  HCT 26.7* 24.1* 23.2*  MCV 83.4 84.3 81.7  PLT 560* 555* 0000000*   Basic Metabolic Panel: Recent Labs  Lab 10/10/2019 1702 10/25/2019 2326 10/17/19 0437 10/18/19 0429  NA 133* 134* 137 136  K 6.2* 6.1* 5.7* 5.7*  CL 92* 94* 96* 98  CO2 19* 23 21* 19*  GLUCOSE  90 65* 66* 77  BUN 139* 145* 147* 141*  CREATININE 18.93* 18.77* 18.28* 18.28*  CALCIUM 9.2 9.1 8.7* 8.0*  MG  --   --   --  2.8*  PHOS  --   --   --  6.7*   GFR: Estimated Creatinine Clearance: 3.7 mL/min (A) (by C-G formula based on SCr of 18.28 mg/dL (H)). Liver Function Tests: Recent Labs  Lab 10/01/2019 1702  AST 9*  ALT 11  ALKPHOS 196*  BILITOT 0.9  PROT 6.4*  ALBUMIN 2.2*   Recent Labs  Lab 10/26/2019 1702  LIPASE 30   No results for input(s): AMMONIA in the last 168 hours. Coagulation Profile: No results for input(s): INR, PROTIME in the last 168 hours. Cardiac Enzymes: No results for input(s): CKTOTAL, CKMB, CKMBINDEX, TROPONINI in the last 168 hours. BNP (last 3 results) No results for input(s): PROBNP in the last 8760 hours. HbA1C: No results for input(s): HGBA1C in the last 72 hours. CBG: Recent Labs  Lab 10/17/19 0901  GLUCAP 86   Lipid Profile: No results for input(s): CHOL, HDL, LDLCALC, TRIG, CHOLHDL, LDLDIRECT in the last 72 hours. Thyroid Function Tests: No results for input(s): TSH, T4TOTAL, FREET4, T3FREE, THYROIDAB in the last 72 hours. Anemia Panel: No results for input(s): VITAMINB12, FOLATE, FERRITIN, TIBC, IRON, RETICCTPCT in the last 72 hours. Sepsis Labs: Recent Labs  Lab 10/04/2019 1702 10/10/2019 2326  LATICACIDVEN 1.0 1.0    Recent Results (from the past 240 hour(s))  SARS CORONAVIRUS 2 (TAT 6-24 HRS) Nasopharyngeal Nasopharyngeal Swab     Status: None   Collection Time: 10/29/2019  7:44 PM   Specimen: Nasopharyngeal Swab  Result Value Ref Range Status   SARS Coronavirus 2 NEGATIVE NEGATIVE Final    Comment: (NOTE) SARS-CoV-2 target nucleic acids are NOT DETECTED. The SARS-CoV-2 RNA is generally detectable in upper and lower respiratory specimens during the acute phase of infection. Negative results do not preclude SARS-CoV-2 infection, do not rule out co-infections with other pathogens, and should not be used as the sole basis  for treatment or other patient management decisions. Negative results must be combined with clinical observations, patient history, and epidemiological information. The expected result is Negative. Fact Sheet for Patients: SugarRoll.be Fact Sheet for Healthcare Providers: https://www.woods-mathews.com/ This test is not yet approved or cleared by the [place] FDA and  has been authorized for detection and/or diagnosis of SARS-CoV-2 by FDA under an Emergency Use Authorization (EUA). This EUA will remain  in effect (meaning this test can be used) for the duration of the COVID-19 declaration under Section 56 4(b)(1) of the Act, 21 U.S.C. section 360bbb-3(b)(1), unless the authorization is terminated or revoked sooner. Performed at [facility], [place] [address], [place], [place] [address]          Radiology Studies: No results found.      Scheduled Meds: . antiseptic oral rinse  15 mL Topical BID  . glycopyrrolate  0.2 mg Intravenous Q4H  . LORazepam  1 mg Intravenous Q4H   Continuous Infusions: .  fentaNYL infusion INTRAVENOUS 100 mcg/hr (2019-11-19 1427)     LOS: 4 days    Time spent: 20 minutes    Sidney Ace, MD [facility] Pager [phone]  If 7PM-7AM, please contact night-coverage 11-19-19, 2:31 PM

## 2019-11-01 DEATH — deceased

## 2020-09-18 IMAGING — CT CT ABD-PELV W/O CM
2 of 8 series · 14 of 46 positions shown, 16 images · non-contrast
Comparison: CT abdomen pelvis 08/22/2019

CLINICAL DATA: Acute renal failure. History colon cancer.

EXAM:
CT ABDOMEN AND PELVIS WITHOUT CONTRAST
TECHNIQUE: Multidetector CT imaging of the abdomen and pelvis was performed
following the standard protocol without IV contrast.

[Series 2: routine abd/pel wo · axial · 0.71mm/px · z∈[-422,-22]mm · 11 of 98 slices shown, 13 images]
[im 9/98  soft-tissue]
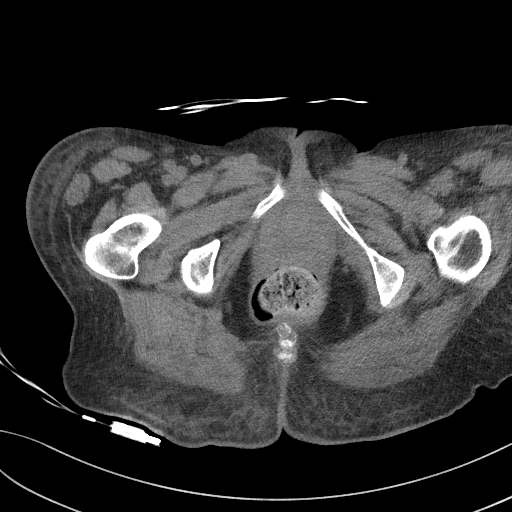
[im 9/98  bone]
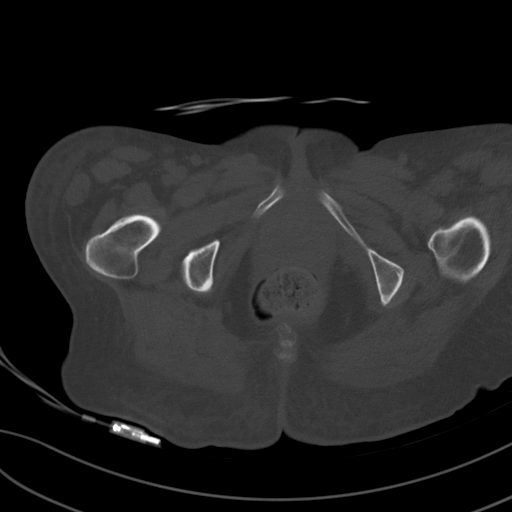
[im 17/98  soft-tissue]
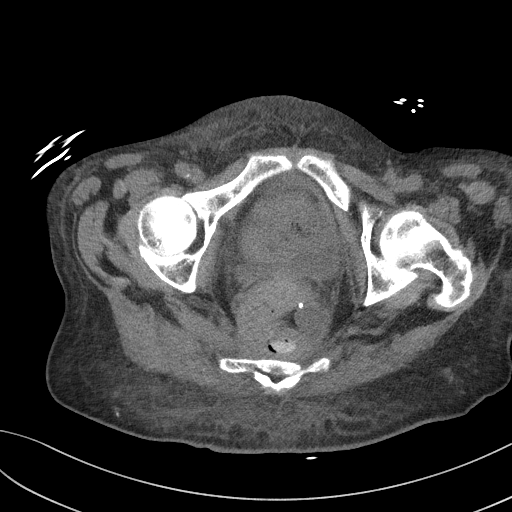
[im 25/98  soft-tissue]
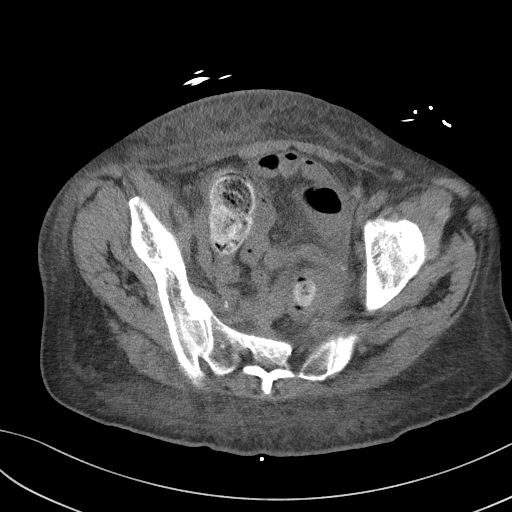
[im 33/98  soft-tissue]
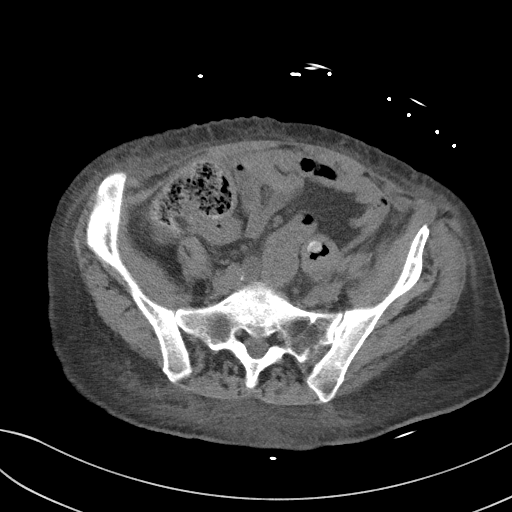
[im 41/98  soft-tissue]
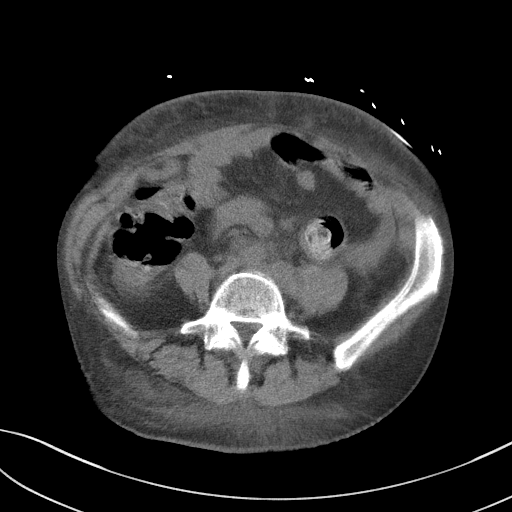
[im 49/98  soft-tissue]
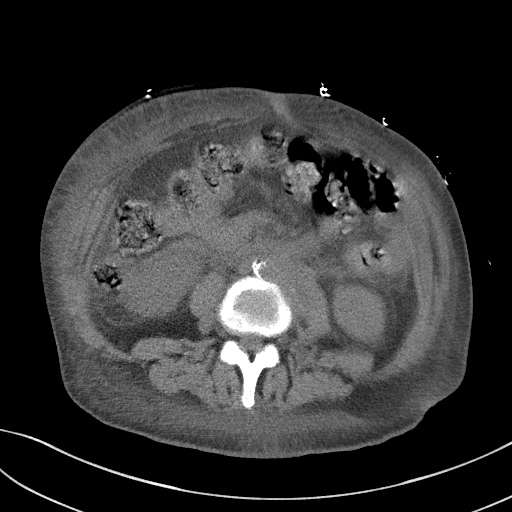
[im 57/98  soft-tissue]
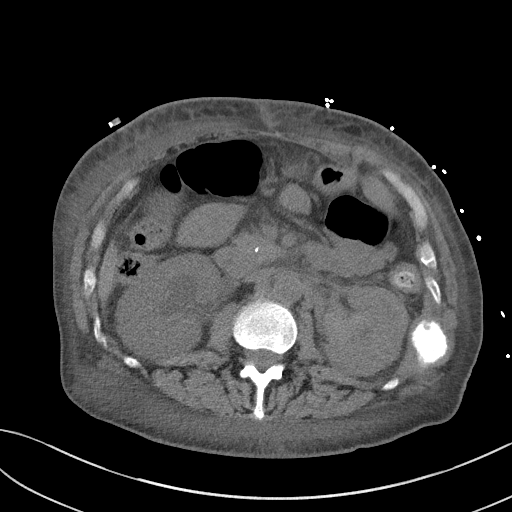
[im 65/98  soft-tissue]
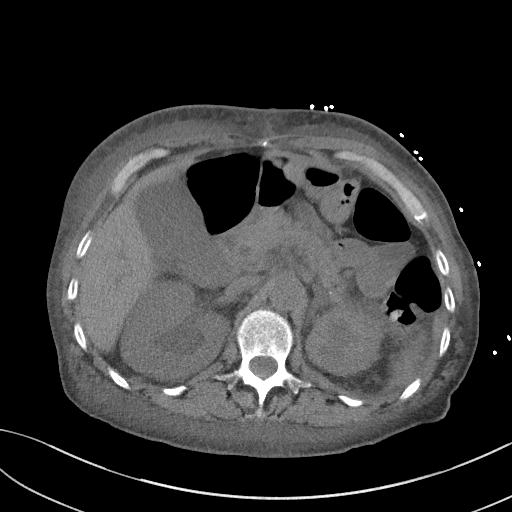
[im 73/98  soft-tissue]
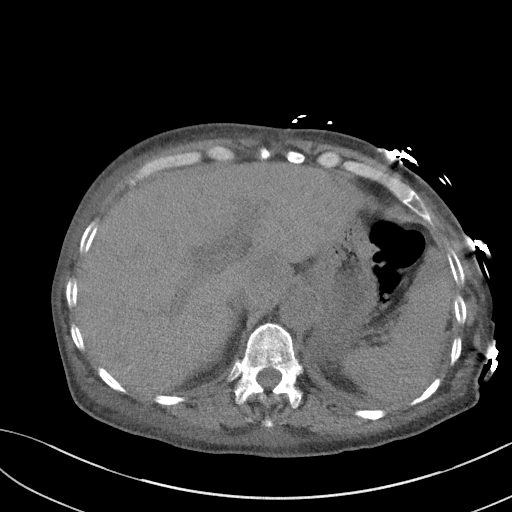
[im 73/98  bone]
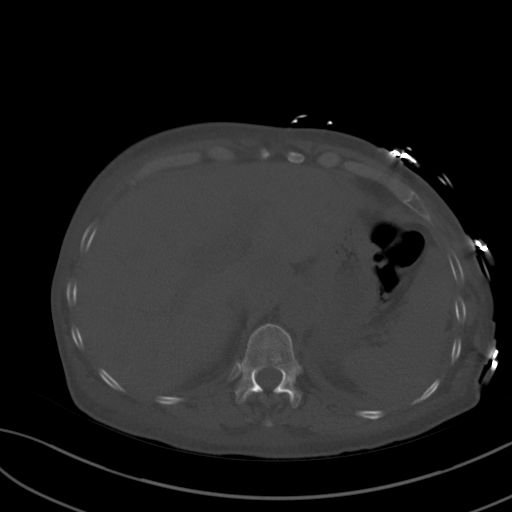
[im 81/98  soft-tissue]
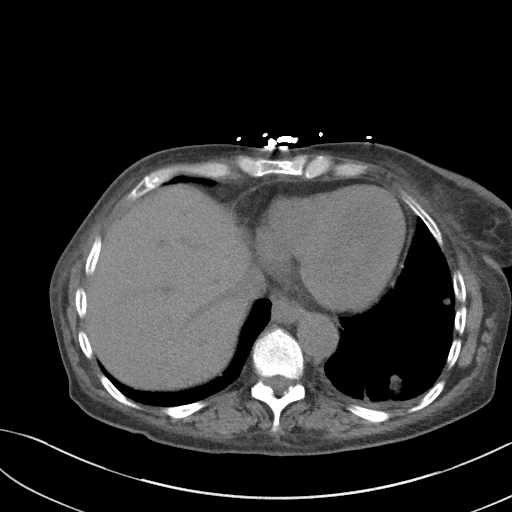
[im 89/98  soft-tissue]
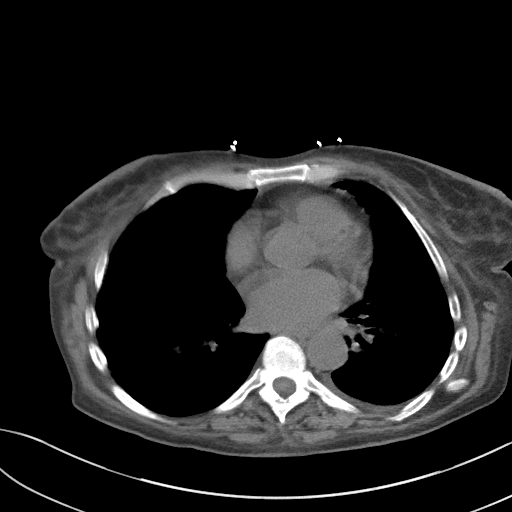

[Series 6: coronal st · coronal · 0.78mm/px · 3 of 89 slices shown]
[im 23/89  soft-tissue]
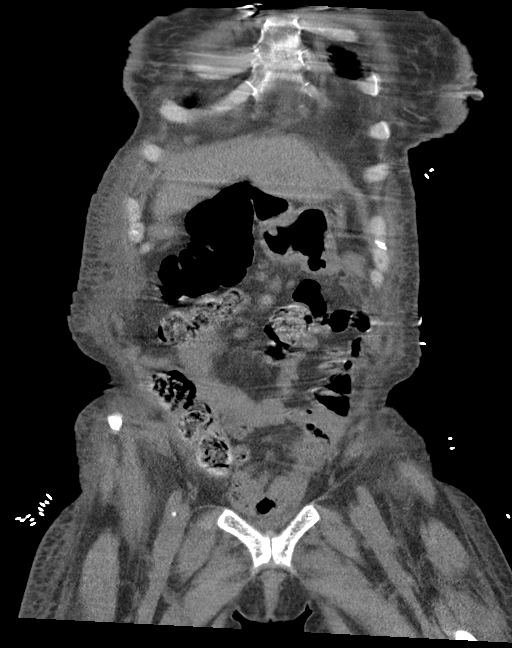
[im 45/89  soft-tissue]
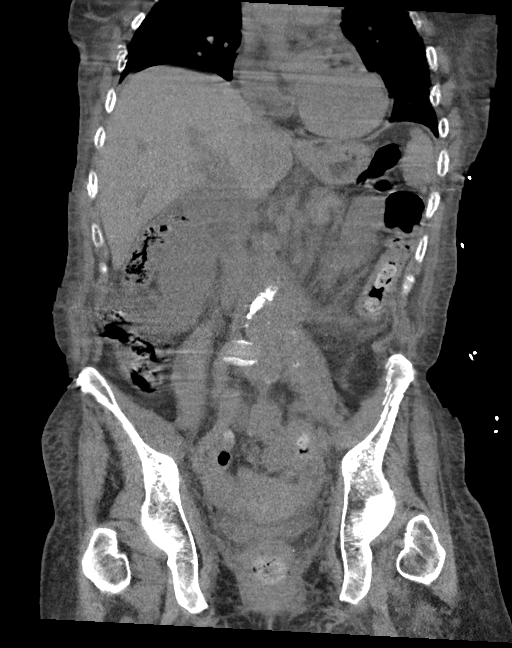
[im 67/89  soft-tissue]
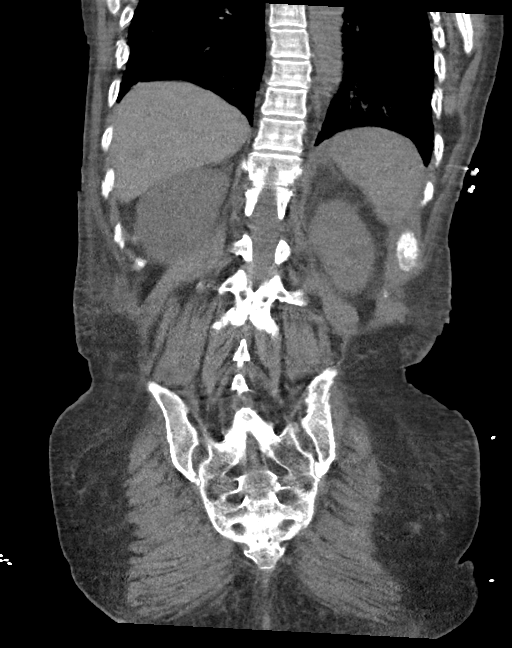

[14 of 46 positions shown; findings below may reference images not displayed]

FINDINGS: The examination is degraded by motion and the lack of intravenous
contrast. This particularly limits comparison of the stage of the
patient's malignancy to the prior study.

LOWER CHEST: Numerous nodules in the lung bases, the largest of
which is in the left lower lobe and measures 1.2 cm.

HEPATOBILIARY: There are numerous hypodense lesions scattered within
the liver. Without IV contrast measurement is difficult. Dilated
gallbladder without inflammatory change.

PANCREAS: Normal pancreas. No ductal dilatation or peripancreatic
fluid collection.

SPLEEN: Splenic lesions are not visible without IV contrast.

ADRENALS/URINARY TRACT: The adrenal glands are normal. There is
moderate right hydronephrosis. The ureter is dilated. A definite
source of obstruction is not visualized. There is hyperdense
material at the lower portion of the left renal collecting system,
similar to the prior study.

STOMACH/BOWEL: There is no hiatal hernia. Normal duodenal course and
caliber. No small bowel dilatation or inflammation. No focal colonic
abnormality. Appendix not definitively visualized.

VASCULAR/LYMPHATIC: There is calcific atherosclerosis of the
abdominal aorta. No abdominal or pelvic lymphadenopathy.

REPRODUCTIVE: Uterus appears normal. No adnexal mass.

MUSCULOSKELETAL. No bony spinal canal stenosis. Left lower rib
lesion is unchanged.

OTHER: Diffuse anasarca
IMPRESSION: 1. Limited study due to motion and lack of intravenous contrast.
2. Moderate right hydronephrosis and hydroureter without definite
source of obstruction. Pelvic mass demonstrated on the prior study
is a possible source, though poorly characterized on this exam.
3. Numerous hypodense lesions scattered within the liver, consistent
with metastatic disease.
4. Numerous pulmonary nodules, consistent with metastatic disease,
worsened.
5. Hyperdense material at the lower portion of the left renal
collecting system, similar to the prior study. No definite increase
in size of left renal mass.
6. Aortic Atherosclerosis ([id number]).

## 2020-09-19 IMAGING — US US RENAL
2 series · 13 of 25 positions shown · non-contrast
Comparison: CT abdomen and pelvis October 16, 2019

CLINICAL DATA: Acute kidney injury

EXAM:
RENAL / URINARY TRACT ULTRASOUND COMPLETE

[Series 1: us renal · 12 of 49 slices shown]
[im 1/49]
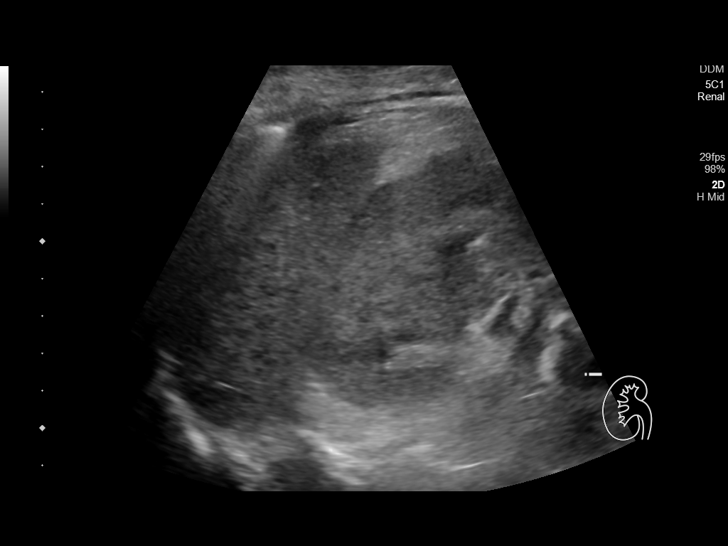
[im 5/49]
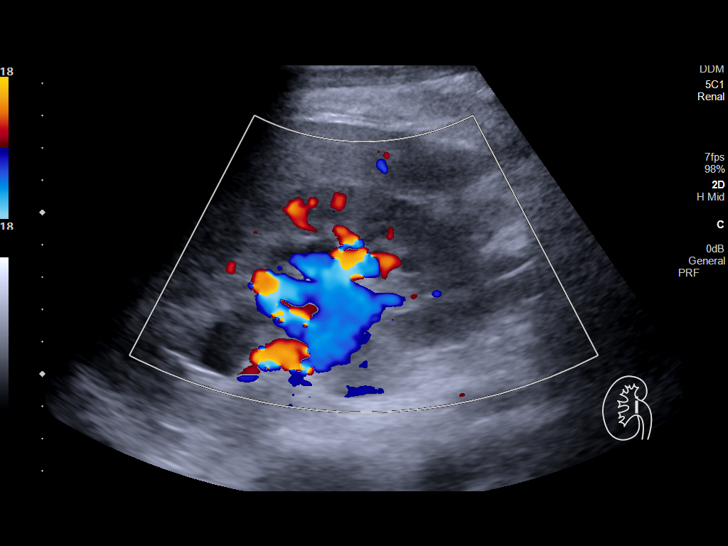
[im 9/49]
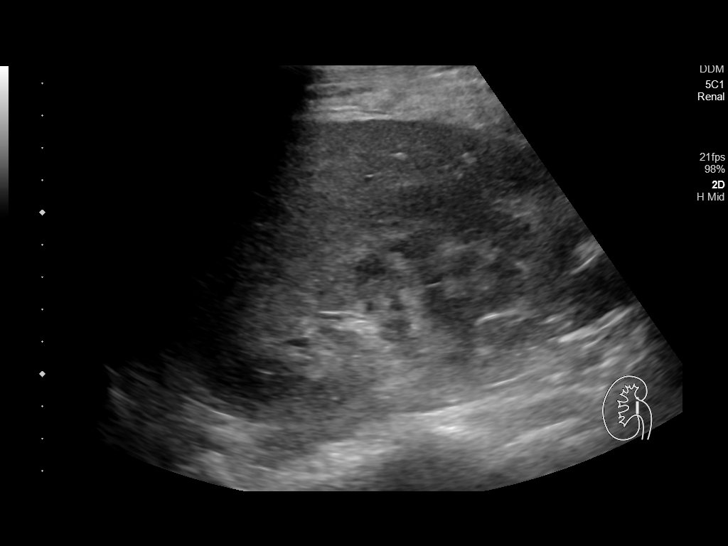
[im 13/49]
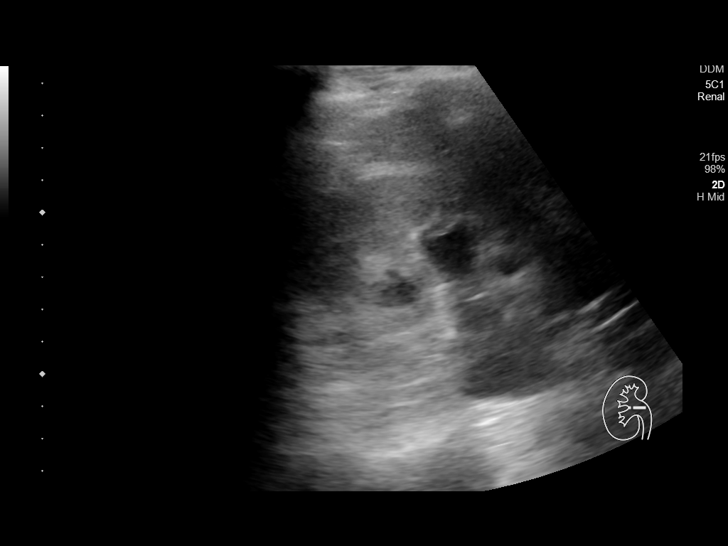
[im 17/49]
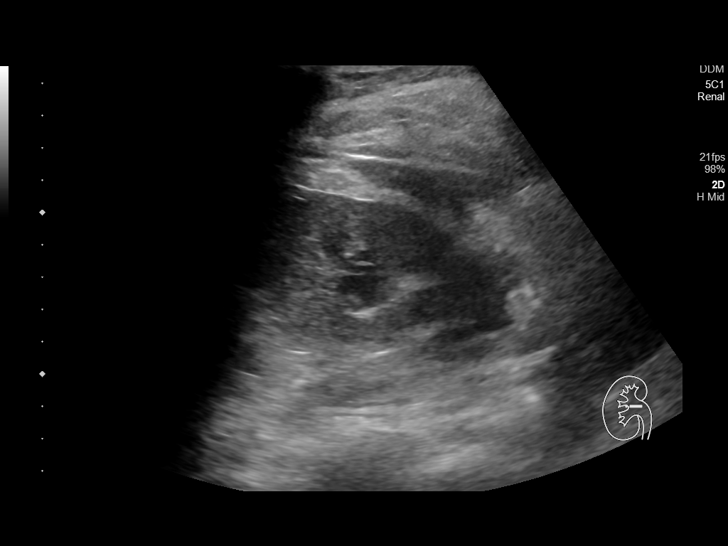
[im 21/49]
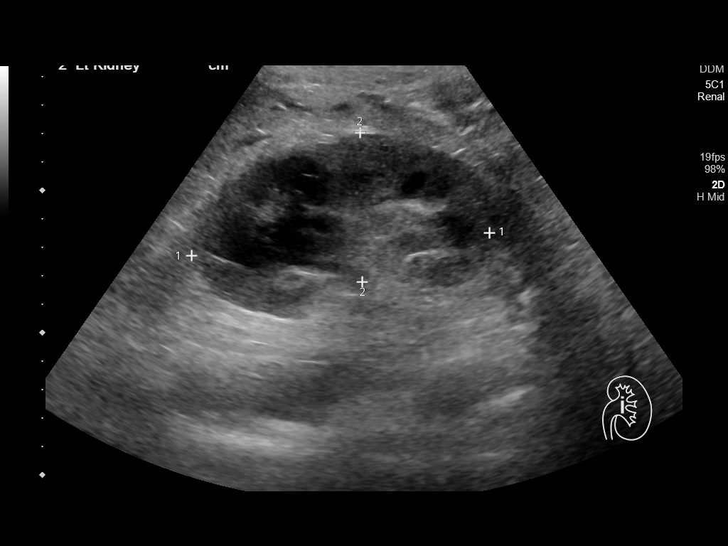
[im 26/49]
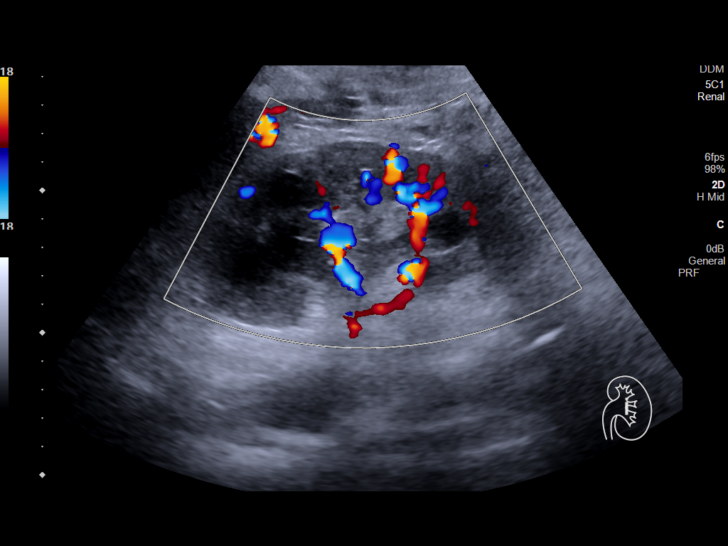
[im 30/49]
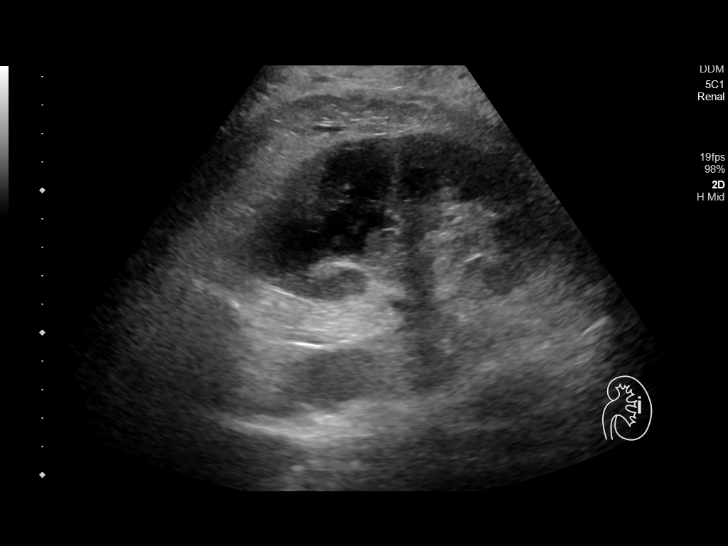
[im 34/49]
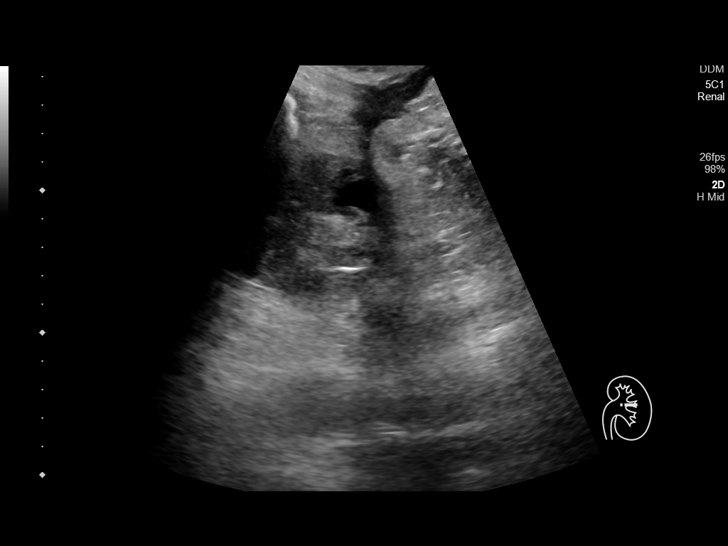
[im 38/49]
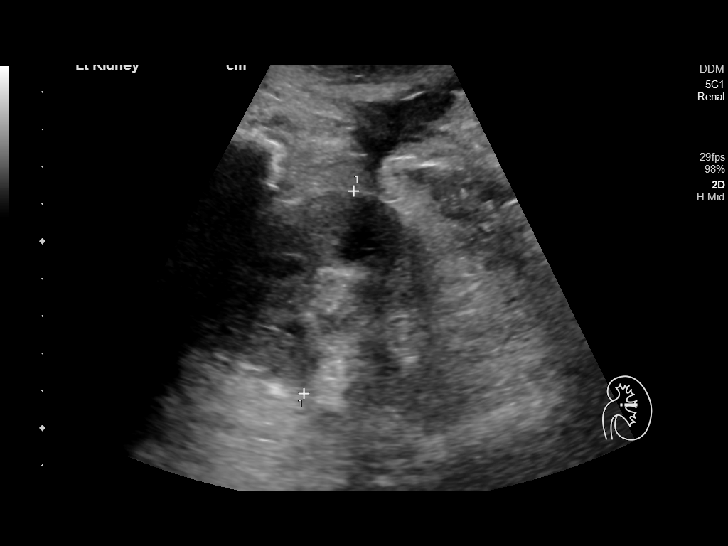
[im 42/49]
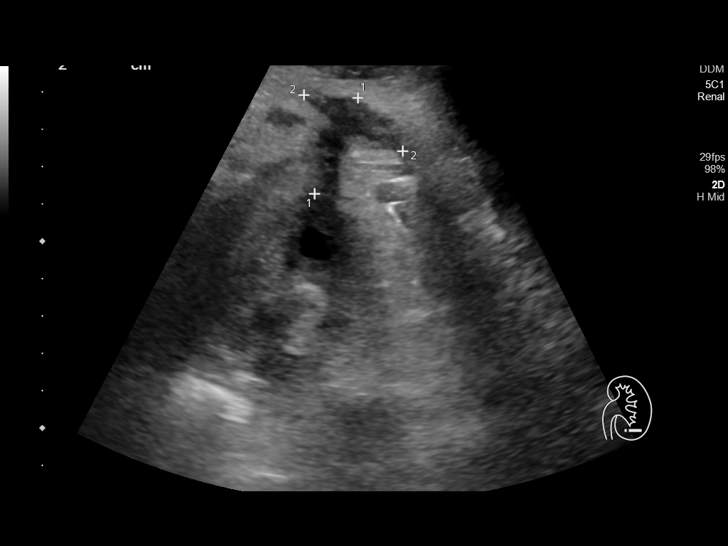
[im 46/49]
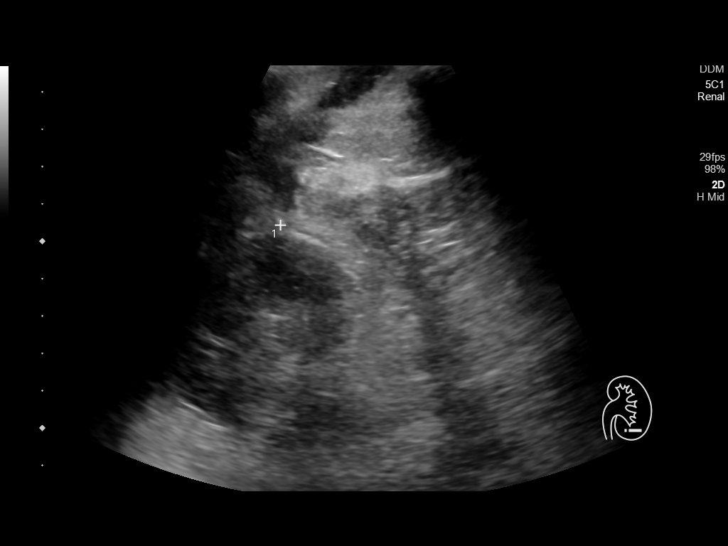

[Series 1001: renal · 1 of 2 slices shown]
[im 1/2]
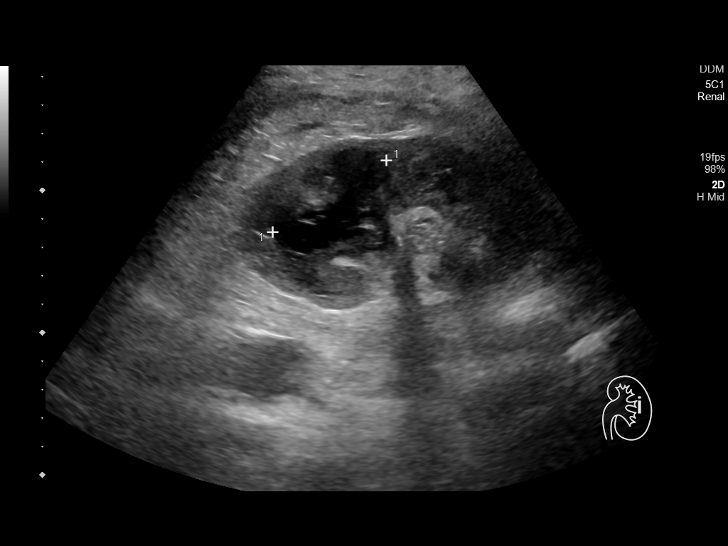

[13 of 25 positions shown; findings below may reference images not displayed]

FINDINGS: Right Kidney:

Renal measurements: 13.2 x 6.6 x 7.6 cm = volume: 331 mL .
Echogenicity and renal cortical thickness are within normal limits.
No mass or perinephric fluid visualized. There is moderate
hydronephrosis on the right.

Left Kidney:

Renal measurements: 10.5 x 5.3 x 5.6 cm = volume: 161 mL.
Echogenicity and renal cortical thickness are within normal limits.
There is a complex fluid collection in the upper pole left kidney
measuring 4.7 x 3.2 x 2.7 cm. A sinus tract extends from the left
kidney laterally to the skin surface, potentially at the site of
previous nephrostomy catheter. There is no appreciable
hydronephrosis on the left.

Bladder:

Appears normal for degree of bladder distention.

Other:

None.
IMPRESSION: 1. Moderate hydronephrosis on the right, similar to CT from 1 day
prior. Site of obstruction not seen.

2. Fluid containing sinus tract from lateral left kidney to skin
surface, apparently at the site of previous nephrostomy catheter.

3. Somewhat complex fluid collection in the upper pole left kidney
which potentially could represent a dilated upper pole calyx with
stenosis causing localized dilatation in this area.
